# Patient Record
Sex: Male | Born: 2000 | Race: White | Hispanic: No | Marital: Single | Smoking: Current every day smoker
Health system: Southern US, Community
[De-identification: ages and names within clinical notes are randomized; demographics above are authoritative.]

## PROBLEM LIST (undated history)

## (undated) DIAGNOSIS — I499 Cardiac arrhythmia, unspecified: Secondary | ICD-10-CM

## (undated) HISTORY — PX: RIGHT HEART CATH: SHX6075

## (undated) HISTORY — PX: TONSILLECTOMY: SUR1361

---
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
## 2018-01-23 ENCOUNTER — Encounter: Payer: Self-pay | Admitting: Emergency Medicine

## 2018-01-23 ENCOUNTER — Emergency Department
Admission: EM | Admit: 2018-01-23 | Discharge: 2018-01-24 | Disposition: A | Discharge status: Home / Self Care | Payer: Medicaid Other | Attending: Emergency Medicine | Admitting: Emergency Medicine

## 2018-01-23 DIAGNOSIS — G4451 Hemicrania continua: Secondary | ICD-10-CM | POA: Insufficient documentation

## 2018-01-23 DIAGNOSIS — Z79899 Other long term (current) drug therapy: Secondary | ICD-10-CM | POA: Insufficient documentation

## 2018-01-23 DIAGNOSIS — M542 Cervicalgia: Secondary | ICD-10-CM | POA: Insufficient documentation

## 2018-01-23 DIAGNOSIS — Z8679 Personal history of other diseases of the circulatory system: Secondary | ICD-10-CM | POA: Diagnosis not present

## 2018-01-23 DIAGNOSIS — R51 Headache: Secondary | ICD-10-CM | POA: Diagnosis present

## 2018-01-23 DIAGNOSIS — R0789 Other chest pain: Secondary | ICD-10-CM | POA: Diagnosis not present

## 2018-01-23 DIAGNOSIS — G4453 Primary thunderclap headache: Secondary | ICD-10-CM

## 2018-01-23 MED ORDER — SODIUM CHLORIDE 0.9 % IV SOLN
2.0000 g | Freq: Once | INTRAVENOUS | Status: DC
  Filled 2018-01-23: qty 20

## 2018-01-23 MED ORDER — FENTANYL CITRATE (PF) 100 MCG/2ML IJ SOLN
75.0000 ug | Freq: Once | INTRAMUSCULAR | Status: AC
  Administered 2018-01-23: 75 ug via INTRAVENOUS
  Filled 2018-01-23: qty 2

## 2018-01-23 MED ORDER — PROCHLORPERAZINE EDISYLATE 10 MG/2ML IJ SOLN
10.0000 mg | Freq: Once | INTRAMUSCULAR | Status: AC
  Administered 2018-01-23: 10 mg via INTRAVENOUS
  Filled 2018-01-23: qty 2

## 2018-01-23 MED ORDER — DEXAMETHASONE SODIUM PHOSPHATE 10 MG/ML IJ SOLN
10.0000 mg | Freq: Once | INTRAMUSCULAR | Status: AC
Start: 2018-01-23 — End: 2018-01-23
  Administered 2018-01-23: 10 mg via INTRAVENOUS
  Filled 2018-01-23: qty 1

## 2018-01-23 MED ORDER — DIPHENHYDRAMINE HCL 50 MG/ML IJ SOLN
50.0000 mg | Freq: Once | INTRAMUSCULAR | Status: AC
  Administered 2018-01-23: 50 mg via INTRAVENOUS
  Filled 2018-01-23: qty 1

## 2018-01-23 MED ORDER — SODIUM CHLORIDE 0.9 % IV BOLUS
1000.0000 mL | Freq: Once | INTRAVENOUS | Status: AC
  Administered 2018-01-23: 1000 mL via INTRAVENOUS

## 2018-01-23 NOTE — ED Triage Notes (Signed)
Pt. Here for migraine.  Pt. States hx of anxiety.

## 2018-01-23 NOTE — ED Triage Notes (Signed)
Pt. Reports migraine headache, neck pain upon flexion and lt. Rib pain.
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
## 2018-01-24 ENCOUNTER — Emergency Department: Payer: Medicaid Other

## 2018-01-24 ENCOUNTER — Encounter: Payer: Self-pay | Admitting: Radiology

## 2018-01-24 LAB — COMPREHENSIVE METABOLIC PANEL
ALT: 15 U/L (ref 0–44)
AST: 30 U/L (ref 15–41)
Albumin: 4.3 g/dL (ref 3.5–5.0)
Alkaline Phosphatase: 66 U/L (ref 38–126)
Anion gap: 12 (ref 5–15)
BUN: 14 mg/dL (ref 6–20)
CALCIUM: 9.2 mg/dL (ref 8.9–10.3)
CHLORIDE: 105 mmol/L (ref 98–111)
CO2: 23 mmol/L (ref 22–32)
Creatinine, Ser: 1 mg/dL (ref 0.61–1.24)
GLUCOSE: 101 mg/dL — AB (ref 70–99)
Potassium: 3 mmol/L — ABNORMAL LOW (ref 3.5–5.1)
Sodium: 140 mmol/L (ref 135–145)
TOTAL PROTEIN: 8.2 g/dL — AB (ref 6.5–8.1)
Total Bilirubin: 0.8 mg/dL (ref 0.3–1.2)

## 2018-01-24 LAB — URINALYSIS, COMPLETE (UACMP) WITH MICROSCOPIC
Bacteria, UA: NONE SEEN
Bilirubin Urine: NEGATIVE
Glucose, UA: 50 mg/dL — AB
Hgb urine dipstick: NEGATIVE
KETONES UR: 5 mg/dL — AB
Leukocytes, UA: NEGATIVE
Nitrite: NEGATIVE
PH: 6 (ref 5.0–8.0)
PROTEIN: NEGATIVE mg/dL
Specific Gravity, Urine: 1.018 (ref 1.005–1.030)
Squamous Epithelial / LPF: NONE SEEN (ref 0–5)

## 2018-01-24 LAB — CBC WITH DIFFERENTIAL/PLATELET
Basophils Absolute: 0 10*3/uL (ref 0–0.1)
Basophils Relative: 0 %
EOS ABS: 0 10*3/uL (ref 0–0.7)
Eosinophils Relative: 0 %
HCT: 43.9 % (ref 40.0–52.0)
HEMOGLOBIN: 15 g/dL (ref 13.0–18.0)
Lymphocytes Relative: 12 %
Lymphs Abs: 2 10*3/uL (ref 1.0–3.6)
MCH: 28.4 pg (ref 26.0–34.0)
MCHC: 34.2 g/dL (ref 32.0–36.0)
MCV: 83 fL (ref 80.0–100.0)
Monocytes Absolute: 0.9 10*3/uL (ref 0.2–1.0)
Monocytes Relative: 6 %
NEUTROS PCT: 82 %
Neutro Abs: 13.4 10*3/uL — ABNORMAL HIGH (ref 1.4–6.5)
Platelets: 354 10*3/uL (ref 150–440)
RBC: 5.29 MIL/uL (ref 4.40–5.90)
RDW: 13.4 % (ref 11.5–14.5)
WBC: 16.3 10*3/uL — ABNORMAL HIGH (ref 3.8–10.6)

## 2018-01-24 LAB — LACTIC ACID, PLASMA: Lactic Acid, Venous: 1.6 mmol/L (ref 0.5–1.9)

## 2018-01-24 LAB — BRAIN NATRIURETIC PEPTIDE: B NATRIURETIC PEPTIDE 5: 6 pg/mL (ref 0.0–100.0)

## 2018-01-24 LAB — TROPONIN I: Troponin I: <0.03 ng/mL (ref <0.03)

## 2018-01-24 MED ORDER — BUTALBITAL-APAP-CAFFEINE 50-325-40 MG PO TABS: 1.0000 | ORAL_TABLET | Freq: Four times a day (QID) | ORAL | 0 refills | Status: AC | PRN

## 2018-01-24 MED ORDER — IOHEXOL 350 MG/ML SOLN
75.0000 mL | Freq: Once | INTRAVENOUS | Status: AC | PRN
  Administered 2018-01-24: 75 mL via INTRAVENOUS

## 2018-01-24 NOTE — Discharge Instructions (Signed)
Fortunately today your lab work and your CT scans were reassuring.  Please take your headache medicine as needed for severe symptoms and follow-up with your primary care physician this coming Monday as needed for reevaluation.  Return to the emergency department sooner for any new or worsening symptoms.  It was a pleasure to take care of you today, and thank you for coming to our emergency department.  If you have any questions or concerns before leaving please ask the nurse to grab me and I'm more than happy to go through your aftercare instructions again.  If you were prescribed any opioid pain medication today such as Norco, Vicodin, Percocet, morphine, hydrocodone, or oxycodone please make sure you do not drive when you are taking this medication as it can alter your ability to drive safely.  If you have any concerns once you are home that you are not improving or are in fact getting worse before you can make it to your follow-up appointment, please do not hesitate to call 911 and come back for further evaluation.  Alex BrittleNeil Shlome Baldree, MD  Results for orders placed or performed during the hospital encounter of 01/23/18  Comprehensive metabolic panel  Result Value Ref Range   Sodium 140 135 - 145 mmol/L   Potassium 3.0 (L) 3.5 - 5.1 mmol/L   Chloride 105 98 - 111 mmol/L   CO2 23 22 - 32 mmol/L   Glucose, Bld 101 (H) 70 - 99 mg/dL   BUN 14 6 - 20 mg/dL   Creatinine, Ser 4.091.00 0.61 - 1.24 mg/dL   Calcium 9.2 8.9 - 81.110.3 mg/dL   Total Protein 8.2 (H) 6.5 - 8.1 g/dL   Albumin 4.3 3.5 - 5.0 g/dL   AST 30 15 - 41 U/L   ALT 15 0 - 44 U/L   Alkaline Phosphatase 66 38 - 126 U/L   Total Bilirubin 0.8 0.3 - 1.2 mg/dL   GFR calc non Af Amer >60 >60 mL/min   GFR calc Af Amer >60 >60 mL/min   Anion gap 12 5 - 15  Lactic acid, plasma  Result Value Ref Range   Lactic Acid, Venous 1.6 0.5 - 1.9 mmol/L  Troponin I  Result Value Ref Range   Troponin I <0.03 <0.03 ng/mL  CBC with Differential  Result  Value Ref Range   WBC 16.3 (H) 3.8 - 10.6 K/uL   RBC 5.29 4.40 - 5.90 MIL/uL   Hemoglobin 15.0 13.0 - 18.0 g/dL   HCT 91.443.9 78.240.0 - 95.652.0 %   MCV 83.0 80.0 - 100.0 fL   MCH 28.4 26.0 - 34.0 pg   MCHC 34.2 32.0 - 36.0 g/dL   RDW 21.313.4 08.611.5 - 57.814.5 %   Platelets 354 150 - 440 K/uL   Neutrophils Relative % 82 %   Neutro Abs 13.4 (H) 1.4 - 6.5 K/uL   Lymphocytes Relative 12 %   Lymphs Abs 2.0 1.0 - 3.6 K/uL   Monocytes Relative 6 %   Monocytes Absolute 0.9 0.2 - 1.0 K/uL   Eosinophils Relative 0 %   Eosinophils Absolute 0.0 0 - 0.7 K/uL   Basophils Relative 0 %   Basophils Absolute 0.0 0 - 0.1 K/uL  Brain natriuretic peptide  Result Value Ref Range   B Natriuretic Peptide 6.0 0.0 - 100.0 pg/mL  Urinalysis, Complete w Microscopic  Result Value Ref Range   Color, Urine YELLOW (A) YELLOW   APPearance CLEAR (A) CLEAR   Specific Gravity, Urine 1.018 1.005 - 1.030  pH 6.0 5.0 - 8.0   Glucose, UA 50 (A) NEGATIVE mg/dL   Hgb urine dipstick NEGATIVE NEGATIVE   Bilirubin Urine NEGATIVE NEGATIVE   Ketones, ur 5 (A) NEGATIVE mg/dL   Protein, ur NEGATIVE NEGATIVE mg/dL   Nitrite NEGATIVE NEGATIVE   Leukocytes, UA NEGATIVE NEGATIVE   RBC / HPF 0-5 0 - 5 RBC/hpf   WBC, UA 0-5 0 - 5 WBC/hpf   Bacteria, UA NONE SEEN NONE SEEN   Squamous Epithelial / LPF NONE SEEN 0 - 5   Mucus PRESENT    Ct Angio Head W Or Wo Contrast  Result Date: 01/24/2018 CLINICAL DATA:  17 y/o M; migraine headache, neck pain on flexion, left-sided rib. EXAM: CT ANGIOGRAPHY HEAD AND NECK TECHNIQUE: Multidetector CT imaging of the head and neck was performed using the standard protocol during bolus administration of intravenous contrast. Multiplanar CT image reconstructions and MIPs were obtained to evaluate the vascular anatomy. Carotid stenosis measurements (when applicable) are obtained utilizing NASCET criteria, using the distal internal carotid diameter as the denominator. CONTRAST:  75 cc Omnipaque 350 COMPARISON:   08/30/2013 CT head. FINDINGS: CT HEAD FINDINGS Brain: No evidence of acute infarction, hemorrhage, hydrocephalus, extra-axial collection or mass lesion/mass effect. Vascular: No hyperdense vessel or unexpected calcification. Skull: Normal. Negative for fracture or focal lesion. Sinuses: Large right maxillary sinus mucous retention cyst. Additional paranasal sinuses and the mastoid air cells are normally aerated. Orbits: No acute finding. Review of the MIP images confirms the above findings CTA NECK FINDINGS Aortic arch: Four-vessel variant branching. Imaged portion shows no evidence of aneurysm or dissection. No significant stenosis of the major arch vessel origins. Right carotid system: No evidence of dissection, stenosis (50% or greater) or occlusion. Left carotid system: No evidence of dissection, stenosis (50% or greater) or occlusion. Vertebral arteries: Right dominant. No evidence of dissection, stenosis (50% or greater) or occlusion. Skeleton: Negative. Other neck: Stable mild prominence of upper cervical and left submandibular lymph nodes, likely reactive. Clear lung apices. Normal thyroid gland. Upper chest: Negative. Review of the MIP images confirms the above findings CTA HEAD FINDINGS Anterior circulation: No significant stenosis, proximal occlusion, aneurysm, or vascular malformation. Posterior circulation: No significant stenosis, proximal occlusion, aneurysm, or vascular malformation. Venous sinuses: As permitted by contrast timing, patent. Anatomic variants: Complete circle-of-Willis. Delayed phase: No abnormal intracranial enhancement. Review of the MIP images confirms the above findings IMPRESSION: Normal CT angiogram of the head and neck. Electronically Signed   By: Mitzi Hansen M.D.   On: 01/24/2018 01:11   Ct Angio Neck W And/or Wo Contrast  Result Date: 01/24/2018 CLINICAL DATA:  17 y/o M; migraine headache, neck pain on flexion, left-sided rib. EXAM: CT ANGIOGRAPHY HEAD AND  NECK TECHNIQUE: Multidetector CT imaging of the head and neck was performed using the standard protocol during bolus administration of intravenous contrast. Multiplanar CT image reconstructions and MIPs were obtained to evaluate the vascular anatomy. Carotid stenosis measurements (when applicable) are obtained utilizing NASCET criteria, using the distal internal carotid diameter as the denominator. CONTRAST:  75 cc Omnipaque 350 COMPARISON:  08/30/2013 CT head. FINDINGS: CT HEAD FINDINGS Brain: No evidence of acute infarction, hemorrhage, hydrocephalus, extra-axial collection or mass lesion/mass effect. Vascular: No hyperdense vessel or unexpected calcification. Skull: Normal. Negative for fracture or focal lesion. Sinuses: Large right maxillary sinus mucous retention cyst. Additional paranasal sinuses and the mastoid air cells are normally aerated. Orbits: No acute finding. Review of the MIP images confirms the above findings CTA NECK FINDINGS  Aortic arch: Four-vessel variant branching. Imaged portion shows no evidence of aneurysm or dissection. No significant stenosis of the major arch vessel origins. Right carotid system: No evidence of dissection, stenosis (50% or greater) or occlusion. Left carotid system: No evidence of dissection, stenosis (50% or greater) or occlusion. Vertebral arteries: Right dominant. No evidence of dissection, stenosis (50% or greater) or occlusion. Skeleton: Negative. Other neck: Stable mild prominence of upper cervical and left submandibular lymph nodes, likely reactive. Clear lung apices. Normal thyroid gland. Upper chest: Negative. Review of the MIP images confirms the above findings CTA HEAD FINDINGS Anterior circulation: No significant stenosis, proximal occlusion, aneurysm, or vascular malformation. Posterior circulation: No significant stenosis, proximal occlusion, aneurysm, or vascular malformation. Venous sinuses: As permitted by contrast timing, patent. Anatomic variants:  Complete circle-of-Willis. Delayed phase: No abnormal intracranial enhancement. Review of the MIP images confirms the above findings IMPRESSION: Normal CT angiogram of the head and neck. Electronically Signed   By: Mitzi Hansen M.D.   On: 01/24/2018 01:11

## 2018-01-24 NOTE — ED Notes (Signed)
Patient transported to CT 

## 2018-11-14 ENCOUNTER — Ambulatory Visit
Admission: EM | Admit: 2018-11-14 | Discharge: 2018-11-14 | Disposition: A | Discharge status: Home / Self Care | Payer: Medicaid Other | Attending: Family Medicine | Admitting: Family Medicine

## 2018-11-14 ENCOUNTER — Other Ambulatory Visit: Payer: Self-pay

## 2018-11-14 ENCOUNTER — Encounter: Payer: Self-pay | Admitting: Emergency Medicine

## 2018-11-14 ENCOUNTER — Ambulatory Visit: Payer: Medicaid Other

## 2018-11-14 DIAGNOSIS — M25521 Pain in right elbow: Secondary | ICD-10-CM | POA: Diagnosis not present

## 2018-11-14 MED ORDER — MELOXICAM 15 MG PO TABS: 15.0000 mg | ORAL_TABLET | Freq: Every day | ORAL | 0 refills | Status: DC | PRN

## 2018-11-14 NOTE — Discharge Instructions (Addendum)
Take medication as prescribed. Rest. Drink plenty of fluids. Ice. Use sling when active. Frequent range of motion exercises as discussed.   Follow-up with orthopedic this coming week for continued pain. See above.   Follow up with your primary care physician this week as needed. Return to Urgent care or emergency room for any numbness, decreased range of motion, worsening pains or worsening concerns.

## 2018-11-14 NOTE — ED Triage Notes (Signed)
Patient c/o pain in his right elbow that goes down into his right arm about 30 min ago.  Patient states that he was sparring.

## 2018-11-14 NOTE — ED Provider Notes (Signed)
[facility] ____________________________________________  Time seen: Approximately 12:50 PM  I have reviewed the triage vital signs and the nursing notes.   HISTORY  Chief Complaint Arm Pain (right)  HPI Alex Hudson. is a 18 y.o. male presenting for evaluation of right elbow pain.  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  Patient reports Wednesday he had several falls on his elbow and he could feel some pain.  States continuing Thursday and Friday he had some pain to his elbow stating he thought he "hyperextended" his elbow and it would get better so he continued to remain active.  States pain increased last night after a training session, and states today he also landed on his elbow having increased pain.  States since injury today his pain in his elbow has been much more present and he has had difficulty extending and flexing his elbow.  States this has been over the last 1 hour.  States currently the pain is better than it was an hour ago, stating earlier the pain was radiating down his forearm to his hand.  Denies any paresthesias or loss of sensation.  States he is able to still fully make a fist and extend all fingers but he has weakened handgrip. Did take one ibuprofen and applied ice.  Denies other alleviating measures.  Denies injuries to the same area in the past.  No recent cough, congestion, chest pain or shortness of breath or other sickness.  Reports otherwise doing well.   Past Medical History:  Diagnosis Date  . Ventricular arrhythmia    RIGHT    There are no active problems to display for this patient.   Past Surgical History:  Procedure Laterality Date  . RIGHT HEART CATH    . TONSILLECTOMY       No current facility-administered medications for this encounter.   Current Outpatient Medications:  .  butalbital-acetaminophen-caffeine (FIORICET, ESGIC)  50-325-40 MG tablet, Take 1-2 tablets by mouth every 6 (six) hours as needed for headache., Disp: 20 tablet, Rfl: 0 .  cholecalciferol (VITAMIN D) 1000 units tablet, Take 1,000 Units by mouth daily., Disp: , Rfl:  .  dicyclomine (BENTYL) 10 MG capsule, Take 1 capsule (10 mg total) by mouth every 6 (six) hours as needed for up to 3 days (abdominal pain or cramping)., Disp: 12 capsule, Rfl: 0 .  famotidine (PEPCID) 20 MG tablet, Take 1 tablet (20 mg total) by mouth 2 (two) times daily for 15 days., Disp: 30 tablet, Rfl: 0 .  meloxicam (MOBIC) 15 MG tablet, Take 1 tablet (15 mg total) by mouth daily as needed., Disp: 10 tablet, Rfl: 0  Allergies Augmentin [amoxicillin-pot clavulanate]  Family History  Problem Relation Age of Onset  . Hypertension Mother   . Hypertension Father     Social History Social History   Tobacco Use  . Smoking status: Never Smoker  . Smokeless tobacco: Never Used  Substance Use Topics  . Alcohol use: No  . Drug use: Not on file    Review of Systems Constitutional: No fever ENT: No sore throat. Cardiovascular: Denies chest pain. Respiratory: Denies shortness of breath. Gastrointestinal: No abdominal pain.   Musculoskeletal: Negative for back pain.  Positive right elbow pain. Skin: Negative for rash.   ____________________________________________   PHYSICAL EXAM:  VITAL SIGNS: ED Triage Vitals  Enc Vitals Group     BP 11/14/18 1231 128/67  Pulse Rate 11/14/18 1231 (!) 102     Resp 11/14/18 1231 16     Temp 11/14/18 1231 98 F (36.7 C)     Temp Source 11/14/18 1231 Oral     SpO2 11/14/18 1231 98 %     Weight 11/14/18 1229 150 lb (68 kg)     Height 11/14/18 1229 5\' 8"  (1.727 m)     Head Circumference --      Peak Flow --      Pain Score 11/14/18 1229 5     Pain Loc --      Pain Edu? --      Excl. in GC? --     Constitutional: Alert and oriented. Well appearing and in no acute distress. Eyes: Conjunctivae are normal. ENT      Head:  Normocephalic and atraumatic. Cardiovascular: Normal rate, regular rhythm. Grossly normal heart sounds.  Good peripheral circulation. Respiratory: Normal respiratory effort without tachypnea nor retractions. Breath sounds are clear and equal bilaterally. No wheezes, rales, rhonchi. Musculoskeletal: Bilateral distal radial pulses equal.  Bilateral distal brachial pulses equal.  Steady gait. Except: Moderate tenderness to posterior bony elbow and mild tenderness to medial elbow, no swelling, no ecchymosis, painful and difficulty fully extending elbow, mild pain and able to flex elbow, no paresthesias to right arm, able to thumb touch to each finger and make full fist and fully extend fingers, able to fully flex and dorsiflex right wrist, right shoulder nontender.  Neurologic:  Normal speech and language.Speech is normal. No gait instability.  Skin:  Skin is warm, dry and intact. No rash noted. Psychiatric: Mood and affect are normal. Speech and behavior are normal. Patient exhibits appropriate insight and judgment   ___________________________________________   LABS (all labs ordered are listed, but only abnormal results are displayed)  Labs Reviewed - No data to display ____________________________________________  RADIOLOGY  Dg Elbow Complete Right  Result Date: 11/14/2018 CLINICAL DATA:  Right elbow pain and decreased range of motion since a hyperextension injury 3 days ago. Initial encounter. EXAM: RIGHT ELBOW - COMPLETE 3+ VIEW COMPARISON:  None. FINDINGS: There is no evidence of fracture, dislocation, or joint effusion. There is no evidence of arthropathy or other focal bone abnormality. Soft tissues are unremarkable. IMPRESSION: Negative exam. Electronically Signed   By: Drusilla Kannerhomas  Dalessio M.D.   On: 11/14/2018 13:45   ____________________________________________   PROCEDURES Procedures    INITIAL IMPRESSION / ASSESSMENT AND PLAN / ED COURSE  Pertinent labs & imaging results that  were available during my care of the patient were reviewed by me and considered in my medical decision making (see chart for details).  Well-appearing patient.  No acute distress.  Right elbow pain after multiple recent injuries.  Some limitation range of motion of elbow extension, no paresthesias. Right elbow x-ray as above per radiologist, negative.  Concern for contusion and sprain injuries.  Recommend ice, elevation, rest, and will Rx Mobic.  Sling given for support, however counseled regarding frequent range of motion exercises to shoulder and elbow are important.  Follow-up with orthopedic this coming week.  Discussed strict reevaluation and return parameters.Discussed indication, risks and benefits of medications with patient.  Discussed follow up with Primary care physician this week as needed. Discussed follow up and return parameters including no resolution or any worsening concerns. Patient verbalized understanding and agreed to plan.   ____________________________________________   FINAL CLINICAL IMPRESSION(S) / ED DIAGNOSES  Final diagnoses:  Right elbow pain     ED Discharge  Orders         Ordered    meloxicam (MOBIC) 15 MG tablet  Daily PRN     11/14/18 1358           Note: This dictation was prepared with Dragon dictation along with smaller phrase technology. Any transcriptional errors that result from this process are unintentional.         Renford DillsMiller, Leighton Brickley, NP 11/14/18 1428

## 2018-12-09 ENCOUNTER — Other Ambulatory Visit: Payer: Self-pay

## 2018-12-09 DIAGNOSIS — Z20822 Contact with and (suspected) exposure to covid-19: Secondary | ICD-10-CM

## 2018-12-10 LAB — NOVEL CORONAVIRUS, NAA: SARS-CoV-2, NAA: NOT DETECTED

## 2019-07-10 ENCOUNTER — Inpatient Hospital Stay
Admission: EM | Admit: 2019-07-10 | Discharge: 2019-07-12 | DRG: 076 | Disposition: A | Discharge status: Home / Self Care | Payer: Medicaid Other | Attending: Internal Medicine | Admitting: Internal Medicine

## 2019-07-10 ENCOUNTER — Emergency Department: Payer: Medicaid Other

## 2019-07-10 ENCOUNTER — Other Ambulatory Visit: Payer: Self-pay

## 2019-07-10 DIAGNOSIS — A879 Viral meningitis, unspecified: Secondary | ICD-10-CM | POA: Diagnosis not present

## 2019-07-10 DIAGNOSIS — R519 Headache, unspecified: Secondary | ICD-10-CM

## 2019-07-10 DIAGNOSIS — Z8249 Family history of ischemic heart disease and other diseases of the circulatory system: Secondary | ICD-10-CM

## 2019-07-10 DIAGNOSIS — G039 Meningitis, unspecified: Secondary | ICD-10-CM | POA: Diagnosis present

## 2019-07-10 DIAGNOSIS — Z881 Allergy status to other antibiotic agents status: Secondary | ICD-10-CM

## 2019-07-10 DIAGNOSIS — Z20822 Contact with and (suspected) exposure to covid-19: Secondary | ICD-10-CM | POA: Diagnosis present

## 2019-07-10 DIAGNOSIS — G43909 Migraine, unspecified, not intractable, without status migrainosus: Secondary | ICD-10-CM | POA: Diagnosis present

## 2019-07-10 DIAGNOSIS — Z79899 Other long term (current) drug therapy: Secondary | ICD-10-CM

## 2019-07-10 DIAGNOSIS — T375X5A Adverse effect of antiviral drugs, initial encounter: Secondary | ICD-10-CM | POA: Diagnosis not present

## 2019-07-10 DIAGNOSIS — R197 Diarrhea, unspecified: Secondary | ICD-10-CM

## 2019-07-10 DIAGNOSIS — L258 Unspecified contact dermatitis due to other agents: Secondary | ICD-10-CM | POA: Diagnosis not present

## 2019-07-10 DIAGNOSIS — F909 Attention-deficit hyperactivity disorder, unspecified type: Secondary | ICD-10-CM | POA: Diagnosis present

## 2019-07-10 DIAGNOSIS — E876 Hypokalemia: Secondary | ICD-10-CM | POA: Diagnosis present

## 2019-07-10 LAB — CSF CELL COUNT WITH DIFFERENTIAL
Eosinophils, CSF: 0 %
Eosinophils, CSF: 0 %
Lymphs, CSF: 24 %
Lymphs, CSF: 9 %
Monocyte-Macrophage-Spinal Fluid: 0 %
Monocyte-Macrophage-Spinal Fluid: 1 %
Other Cells, CSF: 0
Other Cells, CSF: 0
RBC Count, CSF: 547 /mm3 — ABNORMAL HIGH (ref 0–3)
RBC Count, CSF: 55 /mm3 — ABNORMAL HIGH (ref 0–3)
Segmented Neutrophils-CSF: 0 %
Segmented Neutrophils-CSF: 2 %
Tube #: 1
Tube #: 3
WBC, CSF: 28 /mm3 (ref 0–5)
WBC, CSF: 7 /mm3 — ABNORMAL HIGH (ref 0–5)

## 2019-07-10 LAB — SARS CORONAVIRUS 2 (TAT 6-24 HRS): SARS Coronavirus 2: NEGATIVE

## 2019-07-10 LAB — BASIC METABOLIC PANEL
Anion gap: 7 (ref 5–15)
BUN: 15 mg/dL (ref 6–20)
CO2: 24 mmol/L (ref 22–32)
Calcium: 9.2 mg/dL (ref 8.9–10.3)
Chloride: 104 mmol/L (ref 98–111)
Creatinine, Ser: 1.01 mg/dL (ref 0.61–1.24)
GFR calc Af Amer: >60 mL/min (ref >60)
GFR calc non Af Amer: >60 mL/min (ref >60)
Glucose, Bld: 118 mg/dL — ABNORMAL HIGH (ref 70–99)
Potassium: 3.4 mmol/L — ABNORMAL LOW (ref 3.5–5.1)
Sodium: 135 mmol/L (ref 135–145)

## 2019-07-10 LAB — URINE DRUG SCREEN, QUALITATIVE (ARMC ONLY)
Amphetamines, Ur Screen: NOT DETECTED
Barbiturates, Ur Screen: NOT DETECTED
Benzodiazepine, Ur Scrn: NOT DETECTED
Cannabinoid 50 Ng, Ur ~~LOC~~: NOT DETECTED
Cocaine Metabolite,Ur ~~LOC~~: NOT DETECTED
MDMA (Ecstasy)Ur Screen: NOT DETECTED
Methadone Scn, Ur: NOT DETECTED
Opiate, Ur Screen: NOT DETECTED
Phencyclidine (PCP) Ur S: NOT DETECTED
Tricyclic, Ur Screen: NOT DETECTED

## 2019-07-10 LAB — CBC WITH DIFFERENTIAL/PLATELET
Abs Immature Granulocytes: 0.03 10*3/uL (ref 0.00–0.07)
Basophils Absolute: 0 10*3/uL (ref 0.0–0.1)
Basophils Relative: 0 %
Eosinophils Absolute: 0.2 10*3/uL (ref 0.0–0.5)
Eosinophils Relative: 3 %
HCT: 43.8 % (ref 39.0–52.0)
Hemoglobin: 14.6 g/dL (ref 13.0–17.0)
Immature Granulocytes: 0 %
Lymphocytes Relative: 45 %
Lymphs Abs: 4.2 10*3/uL — ABNORMAL HIGH (ref 0.7–4.0)
MCH: 28 pg (ref 26.0–34.0)
MCHC: 33.3 g/dL (ref 30.0–36.0)
MCV: 83.9 fL (ref 80.0–100.0)
Monocytes Absolute: 0.7 10*3/uL (ref 0.1–1.0)
Monocytes Relative: 7 %
Neutro Abs: 4.1 10*3/uL (ref 1.7–7.7)
Neutrophils Relative %: 45 %
Platelets: 375 10*3/uL (ref 150–400)
RBC: 5.22 MIL/uL (ref 4.22–5.81)
RDW: 12.2 % (ref 11.5–15.5)
WBC: 9.2 10*3/uL (ref 4.0–10.5)
nRBC: 0 % (ref 0.0–0.2)

## 2019-07-10 LAB — POC SARS CORONAVIRUS 2 AG: SARS Coronavirus 2 Ag: NEGATIVE

## 2019-07-10 LAB — ETHANOL: Alcohol, Ethyl (B): <10 mg/dL (ref <10)

## 2019-07-10 LAB — MAGNESIUM: Magnesium: 2.2 mg/dL (ref 1.7–2.4)

## 2019-07-10 LAB — PROTEIN AND GLUCOSE, CSF
Glucose, CSF: 56 mg/dL (ref 40–70)
Total  Protein, CSF: 25 mg/dL (ref 15–45)

## 2019-07-10 MED ORDER — LIDOCAINE-PRILOCAINE 2.5-2.5 % EX CREA
TOPICAL_CREAM | Freq: Once | CUTANEOUS | Status: AC
  Filled 2019-07-10: qty 5

## 2019-07-10 MED ORDER — ACETAMINOPHEN 325 MG PO TABS
650.0000 mg | ORAL_TABLET | Freq: Four times a day (QID) | ORAL | Status: DC | PRN
  Administered 2019-07-11 (×2): 650 mg via ORAL
  Filled 2019-07-10 (×2): qty 2

## 2019-07-10 MED ORDER — SODIUM CHLORIDE 0.9 % IV BOLUS
1000.0000 mL | Freq: Once | INTRAVENOUS | Status: AC
  Administered 2019-07-10: 1000 mL via INTRAVENOUS

## 2019-07-10 MED ORDER — METOCLOPRAMIDE HCL 5 MG/ML IJ SOLN
10.0000 mg | Freq: Once | INTRAMUSCULAR | Status: AC
  Administered 2019-07-10: 10 mg via INTRAVENOUS
  Filled 2019-07-10: qty 2

## 2019-07-10 MED ORDER — LORAZEPAM 2 MG/ML IJ SOLN
INTRAMUSCULAR | Status: AC
  Administered 2019-07-10: 1 mg via INTRAVENOUS
  Filled 2019-07-10: qty 1

## 2019-07-10 MED ORDER — DEXTROSE 5 % IV SOLN
10.0000 mg/kg | Freq: Three times a day (TID) | INTRAVENOUS | Status: DC
  Administered 2019-07-10: 660 mg via INTRAVENOUS
  Filled 2019-07-10 (×6): qty 13.2

## 2019-07-10 MED ORDER — DICYCLOMINE HCL 10 MG PO CAPS
10.0000 mg | ORAL_CAPSULE | Freq: Four times a day (QID) | ORAL | Status: DC | PRN
  Filled 2019-07-10: qty 1

## 2019-07-10 MED ORDER — ONDANSETRON HCL 4 MG PO TABS: 4.0000 mg | ORAL_TABLET | Freq: Four times a day (QID) | ORAL | Status: DC | PRN

## 2019-07-10 MED ORDER — FAMOTIDINE 20 MG PO TABS
20.0000 mg | ORAL_TABLET | Freq: Two times a day (BID) | ORAL | Status: DC
  Administered 2019-07-11 – 2019-07-12 (×2): 20 mg via ORAL
  Filled 2019-07-10 (×3): qty 1

## 2019-07-10 MED ORDER — DEXTROSE 5 % IV SOLN
10.0000 mg/kg | Freq: Once | INTRAVENOUS | Status: DC
  Filled 2019-07-10: qty 13.2

## 2019-07-10 MED ORDER — ONDANSETRON HCL 4 MG/2ML IJ SOLN
4.0000 mg | Freq: Four times a day (QID) | INTRAMUSCULAR | Status: DC | PRN
  Administered 2019-07-11 – 2019-07-12 (×3): 4 mg via INTRAVENOUS
  Filled 2019-07-10 (×3): qty 2

## 2019-07-10 MED ORDER — POTASSIUM CHLORIDE IN NACL 20-0.9 MEQ/L-% IV SOLN
INTRAVENOUS | Status: DC
  Filled 2019-07-10 (×7): qty 1000

## 2019-07-10 MED ORDER — LORAZEPAM 2 MG/ML IJ SOLN: 1.0000 mg | Freq: Once | INTRAMUSCULAR | Status: AC

## 2019-07-10 MED ORDER — KETAMINE HCL 10 MG/ML IJ SOLN: 1.0000 mg/kg | Freq: Once | INTRAMUSCULAR | Status: DC

## 2019-07-10 MED ORDER — ENOXAPARIN SODIUM 40 MG/0.4ML ~~LOC~~ SOLN
40.0000 mg | SUBCUTANEOUS | Status: DC
  Filled 2019-07-10: qty 0.4

## 2019-07-10 MED ORDER — ACETAMINOPHEN 650 MG RE SUPP: 650.0000 mg | Freq: Four times a day (QID) | RECTAL | Status: DC | PRN

## 2019-07-10 MED ORDER — MELOXICAM 7.5 MG PO TABS
15.0000 mg | ORAL_TABLET | Freq: Every day | ORAL | Status: DC | PRN
  Filled 2019-07-10 (×2): qty 2

## 2019-07-10 MED ORDER — SODIUM CHLORIDE 0.9 % IV SOLN
2.0000 g | Freq: Once | INTRAVENOUS | Status: AC
  Administered 2019-07-10: 08:00:00 2 g via INTRAVENOUS
  Filled 2019-07-10: qty 20
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
## 2019-07-10 NOTE — ED Notes (Signed)
ED Provider at bedside. 
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
## 2019-07-10 NOTE — ED Notes (Signed)
Report given to Kim, RN.

## 2019-07-10 NOTE — Progress Notes (Signed)
ID PROGRESS NOTE  ED provider, Dr Roxan Hockey, asked me to weigh in on management of viral/aseptic meningitis in 18yo M with pmhx of migraine ha, who now has new onset headache, neck pain. Nausea, photophobia which started the night prior at work. He underwent infectious work up. CSF appears 1st tube traumatic tap with several RBC of 547, WBC 6 with 9% L, protein 25 and glu 56. #3rd tube shows WBC of 28 with no seg and 24% lymph. No organism on gram stain. Patient still appears not back to his baseline, still groggy post LP. He was initially started on ceftriaxone, however when they noticed cell count, they called ID for recommendations.  Serum wbc of 9.2 45% seg, 45% lymph  A/P: 18yo M with what appears to be aseptic meningitis, possibly due to viral causes 1) please start IV acyclovir, and keep patient well hydrated with IVF 100-166mL/hr NS 2) please get HSV DNA PCR added to CSF 3) consider getting MRI of brain with contrast if patient is not improving over the next 24hrs 4) will also add enterovirus panel to CSF  Alex Bour B. Drue Second MD MPH [facility] [phone]

## 2019-07-10 NOTE — Progress Notes (Signed)
Pt began to have swelling and redness with pain at IV site. IV fluids and IV Acyclovir stopped. MD notified.

## 2019-07-10 NOTE — Progress Notes (Signed)
Pt on the floor from ED with suspected Viral meningitis. A&O x4. IV in place. RA. Oriented to unit, staff and call bell placed within reach. RN to release orders and follow current care plan.

## 2019-07-10 NOTE — ED Notes (Signed)
Pt ambulated to the restroom with stand by assistance.

## 2019-07-10 NOTE — H&P (Signed)
History and Physical    Alex Hudson. TKW:[id number] DOB: 07-22-2000 DOA: 07/10/2019  PCP: Therapy, [facility]   Patient coming from: Home  I have personally briefly reviewed patient's old medical records in [place]  Chief Complaint: Headache  HPI: Alex Werts. is a 19 y.o. male with medical history significant for migraine headaches, accelerated ventricular rhythm, ADHD and asthma who presented to the emergency room for evaluation of headache and mental status changes.  Patient said he fell out of a vehicle outside the emergency room when he tried to throw up.  He was picked up the sidewalk and carried into the emergency room.  He works third shift at [employer] and stated that he started having headaches which he described as global and he assumed it was his usual migraine headaches.  Intensity of the headache was initially mild and then it became severe after he drank some soda.  It was associated with nausea and dry heaving as well as photophobia and neck pain but no vomiting.  He denied any trauma, he denies drug or alcohol use and denied having a fever.  He complained of chills.  He denies having any chest pain, shortness of breath, nausea, vomiting, diaphoresis or palpitations.  He has no abdominal pain or changes in his bowel habits.  Denies having any urinary symptoms  ED Course: Patient was seen in the emergency room and due to concerns for possible meningitis he had a lumbar puncture done. CSF fluid had RBC and lymphocyte predominance. Patient continues to have waxing and waning of his mental status. ED provider discussed with ID who recommends observation admission for possible viral meningitis.  Review of Systems: As per HPI otherwise 10 point review of systems negative.    Past Medical History:  Diagnosis Date  . Ventricular arrhythmia    RIGHT    Past Surgical History:  Procedure Laterality Date  . RIGHT HEART CATH    . TONSILLECTOMY        reports that he has never smoked. He has never used smokeless tobacco. He reports that he does not drink alcohol. No history on file for drug.  Allergies  Allergen Reactions  . Augmentin [Amoxicillin-Pot Clavulanate] Diarrhea    Family History  Problem Relation Age of Onset  . Hypertension Mother   . Hypertension Father      Prior to Admission medications   Medication Sig Start Date End Date Taking? Authorizing Provider  cholecalciferol (VITAMIN D) 1000 units tablet Take 1,000 Units by mouth daily.    [provider]  meloxicam (MOBIC) 15 MG tablet Take 1 tablet (15 mg total) by mouth daily as needed. 11/14/18   Marylene Land, NP  cetirizine (ZYRTEC) 10 MG tablet Take 10 mg by mouth at bedtime as needed for allergies. 09/10/17 11/14/18  [provider]  fluticasone (FLONASE) 50 MCG/ACT nasal spray Place 1 spray into both nostrils daily.  11/14/18  [provider]    Physical Exam: Vitals:   07/10/19 0641 07/10/19 0700 07/10/19 0715 07/10/19 0930  BP: 131/84 (!) 129/94 (!) 128/91 114/72  Pulse: (!) 107 (!) 103 87 87  Resp: (!) 23 11 18 18   Temp:      TempSrc:      SpO2: 100% 100% 98% 97%  Weight:      Height:         Vitals:   07/10/19 0641 07/10/19 0700 07/10/19 0715 07/10/19 0930  BP: 131/84 (!) 129/94 (!) 128/91  114/72  Pulse: (!) 107 (!) 103 87 87  Resp: (!) 23 11 18 18   Temp:      TempSrc:      SpO2: 100% 100% 98% 97%  Weight:      Height:        Constitutional: NAD, alert and oriented to person and place but not to time Eyes: PERRL, lids and conjunctivae normal ENMT: Mucous membranes are moist.  Neck: normal, supple, no masses, no thyromegaly Respiratory: clear to auscultation bilaterally, no wheezing, no crackles. Normal respiratory effort. No accessory muscle use.  Cardiovascular: Regular rate and rhythm, no murmurs / rubs / gallops. No extremity edema. 2+ pedal pulses. No carotid bruits.  Abdomen: no tenderness, no masses  palpated. No hepatosplenomegaly. Bowel sounds positive.  Musculoskeletal: no clubbing / cyanosis. No joint deformity upper and lower extremities.  Skin: no rashes, lesions, ulcers.  Neurologic: No gross focal neurologic deficit.  No neck stiffness Psychiatric: Normal mood and affect.   Labs on Admission: I have personally reviewed following labs and imaging studies  CBC: Recent Labs  Lab 07/10/19 0432  WBC 9.2  NEUTROABS 4.1  HGB 14.6  HCT 43.8  MCV 83.9  PLT 375   Basic Metabolic Panel: Recent Labs  Lab 07/10/19 0432  NA 135  K 3.4*  CL 104  CO2 24  GLUCOSE 118*  BUN 15  CREATININE 1.01  CALCIUM 9.2   GFR: Estimated Creatinine Clearance: 108.6 mL/min (by C-G formula based on SCr of 1.01 mg/dL). Liver Function Tests: No results for input(s): AST, ALT, ALKPHOS, BILITOT, PROT, ALBUMIN in the last 168 hours. No results for input(s): LIPASE, AMYLASE in the last 168 hours. No results for input(s): AMMONIA in the last 168 hours. Coagulation Profile: No results for input(s): INR, PROTIME in the last 168 hours. Cardiac Enzymes: No results for input(s): CKTOTAL, CKMB, CKMBINDEX, TROPONINI in the last 168 hours. BNP (last 3 results) No results for input(s): PROBNP in the last 8760 hours. HbA1C: No results for input(s): HGBA1C in the last 72 hours. CBG: No results for input(s): GLUCAP in the last 168 hours. Lipid Profile: No results for input(s): CHOL, HDL, LDLCALC, TRIG, CHOLHDL, LDLDIRECT in the last 72 hours. Thyroid Function Tests: No results for input(s): TSH, T4TOTAL, FREET4, T3FREE, THYROIDAB in the last 72 hours. Anemia Panel: No results for input(s): VITAMINB12, FOLATE, FERRITIN, TIBC, IRON, RETICCTPCT in the last 72 hours. Urine analysis:    Component Value Date/Time   COLORURINE YELLOW (A) 01/23/2018 2338   APPEARANCEUR CLEAR (A) 01/23/2018 2338   APPEARANCEUR Clear 11/14/2013 1455   LABSPEC 1.018 01/23/2018 2338   LABSPEC 1.021 11/14/2013 1455   PHURINE  6.0 01/23/2018 2338   GLUCOSEU 50 (A) 01/23/2018 2338   GLUCOSEU 50 mg/dL 2339 93/71/6967   HGBUR NEGATIVE 01/23/2018 2338   BILIRUBINUR NEGATIVE 01/23/2018 2338   BILIRUBINUR Negative 11/14/2013 1455   KETONESUR 5 (A) 01/23/2018 2338   PROTEINUR NEGATIVE 01/23/2018 2338   NITRITE NEGATIVE 01/23/2018 2338   LEUKOCYTESUR NEGATIVE 01/23/2018 2338   LEUKOCYTESUR Negative 11/14/2013 1455    Radiological Exams on Admission: CT Head Wo Contrast  Result Date: 07/10/2019 CLINICAL DATA:  09/09/2019 out of a car.  Headache. EXAM: CT HEAD WITHOUT CONTRAST TECHNIQUE: Contiguous axial images were obtained from the base of the skull through the vertex without intravenous contrast. COMPARISON:  08/30/2013. FINDINGS: Brain: The brain shows a normal appearance without evidence of malformation, atrophy, old or acute small or large vessel infarction, mass lesion, hemorrhage, hydrocephalus or extra-axial  collection. Vascular: No hyperdense vessel. No evidence of atherosclerotic calcification. Skull: Normal.  No traumatic finding.  No focal bone lesion. Sinuses/Orbits: Sinuses are clear. Orbits appear normal. Mastoids are clear. Other: None significant IMPRESSION: Normal head CT Electronically Signed   By: Paulina Fusi M.D.   On: 07/10/2019 05:59    EKG: Independently reviewed.  Atrial fibrillation  Assessment/Plan Principal Problem:   Viral meningitis, unspecified Active Problems:   Hypokalemia   Migraine headache    Probable viral meningitis Patient presented to the emergency room for evaluation of headache different from from his usual migraine headache associated with confusion and neck stiffness.  He had a lumbar puncture done in the emergency room and the fluid showed lymphocyte predominance and some RBC's. Due to patient's waxing and waning mental status, not back to baseline at the time of admission and is oriented to person and place but not to time, ID was consulted and recommended to place patient on  acyclovir for presumed viral meningitis Follow-up results of CSF culture Symptom control   Hypokalemia Supplement potassium   Migraine headache Resolved Symptom control with NSAIDs and antiemetics as needed   History of accelerated ventricular rhythm Patient follows up with pediatric cardiology at [facility] No exacerbation at this time  DVT prophylaxis: Lovenox Code Status: Full code Family Communication: Plan of care discussed with patient at the bedside Disposition Plan: Back to previous home environment Consults called: ID    Daysha Ashmore MD [facility]     07/10/2019, 11:27 AM

## 2019-07-10 NOTE — ED Provider Notes (Signed)
Patient reassessed. Still feels groggy and somewhat confused. No focal deficits. CSF very likely is traumatic but the third tube does have a lymphocytic predominance with increasing white cell count possibly related to viral meningitis?Marland Kitchen Also has lymphocytic predominance on his CBC. His rapid Covid is negative. He is not toxic appearing but with his presentation and patient not being back to baseline I did discuss the case with infectious disease, Dr. Drue Second who does agree with plan for empirically covering for viral meningitis given his presentation and observation the hospital. Discussed with hospitalist. Patient agreeable to plan.   Willy Eddy, MD 07/10/19 [phone]

## 2019-07-10 NOTE — ED Notes (Signed)
Report received from Paige, RN

## 2019-07-11 ENCOUNTER — Observation Stay: Payer: Medicaid Other

## 2019-07-11 DIAGNOSIS — A879 Viral meningitis, unspecified: Secondary | ICD-10-CM | POA: Diagnosis present

## 2019-07-11 DIAGNOSIS — G43909 Migraine, unspecified, not intractable, without status migrainosus: Secondary | ICD-10-CM | POA: Diagnosis present

## 2019-07-11 DIAGNOSIS — Z20822 Contact with and (suspected) exposure to covid-19: Secondary | ICD-10-CM | POA: Diagnosis present

## 2019-07-11 DIAGNOSIS — R197 Diarrhea, unspecified: Secondary | ICD-10-CM

## 2019-07-11 DIAGNOSIS — L258 Unspecified contact dermatitis due to other agents: Secondary | ICD-10-CM | POA: Diagnosis not present

## 2019-07-11 DIAGNOSIS — E876 Hypokalemia: Secondary | ICD-10-CM | POA: Diagnosis not present

## 2019-07-11 DIAGNOSIS — Z8249 Family history of ischemic heart disease and other diseases of the circulatory system: Secondary | ICD-10-CM | POA: Diagnosis not present

## 2019-07-11 DIAGNOSIS — T375X5A Adverse effect of antiviral drugs, initial encounter: Secondary | ICD-10-CM | POA: Diagnosis not present

## 2019-07-11 DIAGNOSIS — R519 Headache, unspecified: Secondary | ICD-10-CM | POA: Diagnosis not present

## 2019-07-11 DIAGNOSIS — Z79899 Other long term (current) drug therapy: Secondary | ICD-10-CM | POA: Diagnosis not present

## 2019-07-11 DIAGNOSIS — F909 Attention-deficit hyperactivity disorder, unspecified type: Secondary | ICD-10-CM | POA: Diagnosis present

## 2019-07-11 DIAGNOSIS — Z881 Allergy status to other antibiotic agents status: Secondary | ICD-10-CM | POA: Diagnosis not present

## 2019-07-11 LAB — HIV ANTIBODY (ROUTINE TESTING W REFLEX): HIV Screen 4th Generation wRfx: NONREACTIVE

## 2019-07-11 LAB — CBC
HCT: 40.9 % (ref 39.0–52.0)
Hemoglobin: 13.7 g/dL (ref 13.0–17.0)
MCH: 28.4 pg (ref 26.0–34.0)
MCHC: 33.5 g/dL (ref 30.0–36.0)
MCV: 84.9 fL (ref 80.0–100.0)
Platelets: 342 10*3/uL (ref 150–400)
RBC: 4.82 MIL/uL (ref 4.22–5.81)
RDW: 12.2 % (ref 11.5–15.5)
WBC: 7.7 10*3/uL (ref 4.0–10.5)
nRBC: 0 % (ref 0.0–0.2)

## 2019-07-11 LAB — BASIC METABOLIC PANEL
Anion gap: 11 (ref 5–15)
BUN: 9 mg/dL (ref 6–20)
CO2: 22 mmol/L (ref 22–32)
Calcium: 8.7 mg/dL — ABNORMAL LOW (ref 8.9–10.3)
Chloride: 105 mmol/L (ref 98–111)
Creatinine, Ser: 0.73 mg/dL (ref 0.61–1.24)
GFR calc Af Amer: >60 mL/min (ref >60)
GFR calc non Af Amer: >60 mL/min (ref >60)
Glucose, Bld: 74 mg/dL (ref 70–99)
Potassium: 3.8 mmol/L (ref 3.5–5.1)
Sodium: 138 mmol/L (ref 135–145)

## 2019-07-11 LAB — C DIFFICILE QUICK SCREEN W PCR REFLEX
C Diff antigen: NEGATIVE
C Diff interpretation: NOT DETECTED
C Diff toxin: NEGATIVE

## 2019-07-11 MED ORDER — DEXAMETHASONE SODIUM PHOSPHATE 10 MG/ML IJ SOLN
10.0000 mg | Freq: Once | INTRAMUSCULAR | Status: AC
  Administered 2019-07-11: 10 mg via INTRAVENOUS
  Filled 2019-07-11: qty 1

## 2019-07-11 MED ORDER — GADOBUTROL 1 MMOL/ML IV SOLN
6.0000 mL | Freq: Once | INTRAVENOUS | Status: AC | PRN
  Administered 2019-07-11: 12:00:00 6 mL via INTRAVENOUS

## 2019-07-11 MED ORDER — GADOBUTROL 1 MMOL/ML IV SOLN: 6.0000 mL | Freq: Once | INTRAVENOUS | Status: DC | PRN

## 2019-07-11 MED ORDER — MORPHINE SULFATE (PF) 2 MG/ML IV SOLN
1.0000 mg | INTRAVENOUS | Status: DC | PRN
  Administered 2019-07-11: 1 mg via INTRAVENOUS
  Filled 2019-07-11: qty 1

## 2019-07-11 MED ORDER — MAGNESIUM SULFATE 2 GM/50ML IV SOLN
2.0000 g | Freq: Once | INTRAVENOUS | Status: AC
  Administered 2019-07-11: 2 g via INTRAVENOUS
  Filled 2019-07-11: qty 50

## 2019-07-11 MED ORDER — OXYCODONE HCL 5 MG PO TABS
5.0000 mg | ORAL_TABLET | ORAL | Status: DC | PRN
  Administered 2019-07-11 (×2): 5 mg via ORAL
  Filled 2019-07-11 (×2): qty 1

## 2019-07-11 NOTE — Progress Notes (Signed)
Patient ID: Alex Curl., male   DOB: [birth year], 18 y.o.   MRN: [id number] [facility] PROGRESS NOTE  Alex Curl. [id number]:[id number] DOB: Jun 08, 2000 DOA: 07/10/2019 PCP: Therapy, [facility]  HPI/Subjective: Patient still complaining of headache.  He does feel little bit better than when he came in.  Patient having diarrhea and does not realize he is having it.  Still feels nauseous and unable to eat very much.  Light bothers his eyes.  Objective: Vitals:   07/10/19 2006 07/11/19 0356  BP: 132/84 133/89  Pulse: 92 (!) 106  Resp: 16   Temp: 98 F (36.7 C) (!) 97.5 F (36.4 C)  SpO2: 100% 100%    Intake/Output Summary (Last 24 hours) at 07/11/2019 1319 Last data filed at 07/11/2019 0300 Gross per 24 hour  Intake 920.99 ml  Output --  Net 920.99 ml   Filed Weights   07/10/19 0427  Weight: 65.8 kg    ROS: Review of Systems  Constitutional: Negative for chills and fever.  Eyes: Negative for blurred vision.  Respiratory: Negative for cough and shortness of breath.   Cardiovascular: Negative for chest pain.  Gastrointestinal: Positive for diarrhea and nausea. Negative for abdominal pain and vomiting.  Genitourinary: Negative for dysuria.  Musculoskeletal: Negative for joint pain.  Neurological: Positive for dizziness and headaches.   Exam: Physical Exam  Constitutional: He is oriented to person, place, and time.  HENT:  Nose: No mucosal edema.  Mouth/Throat: No oropharyngeal exudate or posterior oropharyngeal edema.  Eyes: Pupils are equal, round, and reactive to light. Conjunctivae and lids are normal.  I sensitive to light  Neck: Carotid bruit is not present.  Cardiovascular: S1 normal and S2 normal. Exam reveals no gallop.  No murmur heard. Respiratory: No respiratory distress. He has no wheezes. He has no rhonchi. He has no rales.  GI: Soft. Bowel sounds are normal. There is no abdominal tenderness.  Musculoskeletal:     Right ankle: No  swelling.     Left ankle: No swelling.  Lymphadenopathy:    He has no cervical adenopathy.  Neurological: He is alert and oriented to person, place, and time.  Skin: Skin is warm. No rash noted. Nails show no clubbing.  Psychiatric: He has a normal mood and affect.      Data Reviewed: Basic Metabolic Panel: Recent Labs  Lab 07/10/19 0432 07/11/19 0447  NA 135 138  K 3.4* 3.8  CL 104 105  CO2 24 22  GLUCOSE 118* 74  BUN 15 9  CREATININE 1.01 0.73  CALCIUM 9.2 8.7*  MG 2.2  --    CBC: Recent Labs  Lab 07/10/19 0432 07/11/19 0447  WBC 9.2 7.7  NEUTROABS 4.1  --   HGB 14.6 13.7  HCT 43.8 40.9  MCV 83.9 84.9  PLT 375 342     Recent Results (from the past 240 hour(s))  CSF culture     Status: None (Preliminary result)   Collection Time: 07/10/19  7:03 AM   Specimen: CSF; Cerebrospinal Fluid  Result Value Ref Range Status   Specimen Description   Final    CSF Performed at [facility], [address] [place], [place] [address]    Special Requests   Final    NONE Performed at [facility], [address] [place], [place] [address]    Gram Stain   Final    NO ORGANISMS SEEN WBC SEEN RED BLOOD CELLS SEEN Performed at [facility],  [address] [place], [place] [address]    Culture   Final    NO GROWTH < 24 HOURS Performed at [facility], [address], [place], [place] [address]    Report Status PENDING  Incomplete  SARS CORONAVIRUS 2 (TAT 6-24 HRS) Nasopharyngeal Nasopharyngeal Swab     Status: None   Collection Time: 07/10/19  8:52 AM   Specimen: Nasopharyngeal Swab  Result Value Ref Range Status   SARS Coronavirus 2 NEGATIVE NEGATIVE Final    Comment: (NOTE) SARS-CoV-2 target nucleic acids are NOT DETECTED. The SARS-CoV-2 RNA is generally detectable in upper and lower respiratory specimens during the acute phase of infection. Negative results do not preclude SARS-CoV-2 infection, do not rule  out co-infections with other pathogens, and should not be used as the sole basis for treatment or other patient management decisions. Negative results must be combined with clinical observations, patient history, and epidemiological information. The expected result is Negative. Fact Sheet for Patients: HairSlick.no Fact Sheet for Healthcare Providers: quierodirigir.com This test is not yet approved or cleared by the [place] FDA and  has been authorized for detection and/or diagnosis of SARS-CoV-2 by FDA under an Emergency Use Authorization (EUA). This EUA will remain  in effect (meaning this test can be used) for the duration of the COVID-19 declaration under Section 56 4(b)(1) of the Act, 21 U.S.C. section 360bbb-3(b)(1), unless the authorization is terminated or revoked sooner. Performed at [facility], [address], [place], [place] [address]      Studies: CT Head Wo Contrast  Result Date: 07/10/2019 CLINICAL DATA:  Larey Seat out of a car.  Headache. EXAM: CT HEAD WITHOUT CONTRAST TECHNIQUE: Contiguous axial images were obtained from the base of the skull through the vertex without intravenous contrast. COMPARISON:  08/30/2013. FINDINGS: Brain: The brain shows a normal appearance without evidence of malformation, atrophy, old or acute small or large vessel infarction, mass lesion, hemorrhage, hydrocephalus or extra-axial collection. Vascular: No hyperdense vessel. No evidence of atherosclerotic calcification. Skull: Normal.  No traumatic finding.  No focal bone lesion. Sinuses/Orbits: Sinuses are clear. Orbits appear normal. Mastoids are clear. Other: None significant IMPRESSION: Normal head CT Electronically Signed   By: Paulina Fusi M.D.   On: 07/10/2019 05:59   MR BRAIN W WO CONTRAST  Result Date: 07/11/2019 CLINICAL DATA:  Headache, neck pain, possible aseptic meningitis EXAM: MRI HEAD WITHOUT AND WITH CONTRAST  TECHNIQUE: Multiplanar, multiecho pulse sequences of the brain and surrounding structures were obtained without and with intravenous contrast. CONTRAST:  58mL GADAVIST GADOBUTROL 1 MMOL/ML IV SOLN COMPARISON:  None. FINDINGS: Motion artifact is present on some sequences. Brain: There is no acute infarction or intracranial hemorrhage. There is no intracranial mass, mass effect, or edema. There is no hydrocephalus or extra-axial fluid collection. No abnormal enhancement. Vascular: Major vessel flow voids at the skull base are preserved. Skull and upper cervical spine: Normal marrow signal is preserved. Sinuses/Orbits: Right maxillary sinus retention cyst or polyp. Orbits are unremarkable. Other: Sella is unremarkable.  Mastoid air cells are clear. IMPRESSION: No evidence of meningitis or encephalitis. No mass, infarction, or hemorrhage. Electronically Signed   By: Guadlupe Spanish M.D.   On: 07/11/2019 12:24    Scheduled Meds: . enoxaparin (LOVENOX) injection  40 mg Subcutaneous Q24H  . famotidine  20 mg Oral BID   Continuous Infusions: . 0.9 % NaCl with KCl 20 mEq / L 100 mL/hr at 07/11/19 0400    Assessment/Plan:  1.  Viral meningitis versus encephalitis.  Patient had welts with the acyclovir so this was discontinued.  Supportive care. 2. Headache.  We will give IV magnesium and IV Decadron and continue to monitor. 3. Diarrhea that is uncontrolled.  We will send off stool studies  Code Status:     Code Status Orders  (From admission, onward)         Start     Ordered   07/10/19 1019  Full code  Continuous     07/10/19 1020        Code Status History    This patient has a current code status but no historical code status.   Advance Care Planning Activity     Family Communication: Patient's mother at the bedside Disposition Plan: Evaluate on a daily basis on when to go home.  If he is feeling better tomorrow I will discharge home. Would like to see headache a little bit better.   Patient will also need to be able to eat better.  Consultants:  Case discussed over epic haiku with infectious disease  Time spent: 5 minutes  [place]

## 2019-07-11 NOTE — Plan of Care (Signed)

## 2019-07-12 DIAGNOSIS — E876 Hypokalemia: Secondary | ICD-10-CM

## 2019-07-12 LAB — BASIC METABOLIC PANEL
Anion gap: 5 (ref 5–15)
BUN: 9 mg/dL (ref 6–20)
CO2: 23 mmol/L (ref 22–32)
Calcium: 8.4 mg/dL — ABNORMAL LOW (ref 8.9–10.3)
Chloride: 108 mmol/L (ref 98–111)
Creatinine, Ser: 0.74 mg/dL (ref 0.61–1.24)
GFR calc Af Amer: >60 mL/min (ref >60)
GFR calc non Af Amer: >60 mL/min (ref >60)
Glucose, Bld: 106 mg/dL — ABNORMAL HIGH (ref 70–99)
Potassium: 4.2 mmol/L (ref 3.5–5.1)
Sodium: 136 mmol/L (ref 135–145)

## 2019-07-12 LAB — CBC
HCT: 42.4 % (ref 39.0–52.0)
Hemoglobin: 14 g/dL (ref 13.0–17.0)
MCH: 27.9 pg (ref 26.0–34.0)
MCHC: 33 g/dL (ref 30.0–36.0)
MCV: 84.6 fL (ref 80.0–100.0)
Platelets: 415 10*3/uL — ABNORMAL HIGH (ref 150–400)
RBC: 5.01 MIL/uL (ref 4.22–5.81)
RDW: 12.1 % (ref 11.5–15.5)
WBC: 12.3 10*3/uL — ABNORMAL HIGH (ref 4.0–10.5)
nRBC: 0 % (ref 0.0–0.2)

## 2019-07-12 MED ORDER — IBUPROFEN 800 MG PO TABS: 800.0000 mg | ORAL_TABLET | Freq: Three times a day (TID) | ORAL | 0 refills | Status: DC | PRN

## 2019-07-12 MED ORDER — ONDANSETRON HCL 4 MG PO TABS: 4.0000 mg | ORAL_TABLET | Freq: Four times a day (QID) | ORAL | 0 refills | Status: DC | PRN

## 2019-07-12 MED ORDER — DEXAMETHASONE SODIUM PHOSPHATE 10 MG/ML IJ SOLN
10.0000 mg | Freq: Once | INTRAMUSCULAR | Status: AC
  Administered 2019-07-12: 10 mg via INTRAVENOUS
  Filled 2019-07-12: qty 1

## 2019-07-12 MED ORDER — MAGNESIUM SULFATE 2 GM/50ML IV SOLN
2.0000 g | Freq: Once | INTRAVENOUS | Status: AC
  Administered 2019-07-12: 2 g via INTRAVENOUS
  Filled 2019-07-12: qty 50

## 2019-07-12 MED ORDER — KETOROLAC TROMETHAMINE 15 MG/ML IJ SOLN
15.0000 mg | Freq: Once | INTRAMUSCULAR | Status: AC
  Administered 2019-07-12: 15 mg via INTRAVENOUS
  Filled 2019-07-12: qty 1

## 2019-07-12 NOTE — Plan of Care (Signed)
The patient has been discharged. Education has been performed and teach back method utilized.

## 2019-07-12 NOTE — Discharge Summary (Signed)
[place] [facility] [place] NAME: Alex Hudson    MR#:  [id number]  DATE OF BIRTH:  01-28-2001  DATE OF ADMISSION:  07/10/2019 ADMITTING PHYSICIAN: Loletha Grayer, MD  DATE OF DISCHARGE: 07/12/2019 11:51 AM  PRIMARY CARE PHYSICIAN: Therapy, [facility]    ADMISSION DIAGNOSIS:  Acute nonintractable headache, unspecified headache type [R51.9] Viral meningitis, unspecified [A87.9] Viral meningitis [A87.9]  DISCHARGE DIAGNOSIS:  Principal Problem:   Viral meningitis, unspecified Active Problems:   Hypokalemia   Migraine headache   Viral meningitis   Acute nonintractable headache   Diarrhea   SECONDARY DIAGNOSIS:   Past Medical History:  Diagnosis Date  . Ventricular arrhythmia    RIGHT    HOSPITAL COURSE:   1.  Viral meningitis versus encephalitis.  The patient was given supportive care here in the hospital.  The patient had welts to the acyclovir so this was discontinued.  The patient was feeling much better on 07/12/2019 and discharged home.  MRI of the brain negative.  Note for out of work for 1 week from today. 2.  Headache.  The patient was given IV magnesium and Decadron and Toradol on 07/11/19 and again on 07/12/19.  Patient was headache free upon discharge. 3.  Diarrhea that was uncontrolled.  Stool for C. difficile negative.  Stool for comprehensive panel pending.  Patient states that the diarrhea has also subsided. 4.  Hypokalemia this was replaced during the hospital course  DISCHARGE CONDITIONS:   Satisfactory  CONSULTS OBTAINED:  Case discussed with Dr. Graylon Good infectious disease over at [place]:   Allergies  Allergen Reactions  . Augmentin [Amoxicillin-Pot Clavulanate] Diarrhea    DISCHARGE MEDICATIONS:   Allergies as of 07/12/2019      Reactions   Augmentin [amoxicillin-pot Clavulanate] Diarrhea      Medication List    STOP taking these medications   meloxicam 15 MG  tablet Commonly known as: MOBIC     TAKE these medications   ibuprofen 800 MG tablet Commonly known as: ADVIL Take 1 tablet (800 mg total) by mouth every 8 (eight) hours as needed.   ondansetron 4 MG tablet Commonly known as: ZOFRAN Take 1 tablet (4 mg total) by mouth every 6 (six) hours as needed for nausea.        DISCHARGE INSTRUCTIONS:   Follow-up with PMD 5 days  If you experience worsening of your admission symptoms, develop shortness of breath, life threatening emergency, suicidal or homicidal thoughts you must seek medical attention immediately by calling 911 or calling your MD immediately  if symptoms less severe.  You Must read complete instructions/literature along with all the possible adverse reactions/side effects for all the Medicines you take and that have been prescribed to you. Take any new Medicines after you have completely understood and accept all the possible adverse reactions/side effects.   Please note  You were cared for by a hospitalist during your hospital stay. If you have any questions about your discharge medications or the care you received while you were in the hospital after you are discharged, you can call the unit and asked to speak with the hospitalist on call if the hospitalist that took care of you is not available. Once you are discharged, your primary care physician will handle any further medical issues. Please note that NO REFILLS for any discharge medications will be authorized once you are discharged, as it is imperative that you return to your primary care  physician (or establish a relationship with a primary care physician if you do not have one) for your aftercare needs so that they can reassess your need for medications and monitor your lab values.    Today   CHIEF COMPLAINT:   Chief Complaint  Patient presents with  . Headache    HISTORY OF PRESENT ILLNESS:  Alex Hudson  is a 19 y.o. male came in with headache, light  sensitivity and altered mental status   VITAL SIGNS:  Blood pressure 125/79, pulse 77, temperature 98 F (36.7 C), temperature source Oral, resp. rate 17, height 5\' 9"  (1.753 m), weight 65.8 kg, SpO2 100 %.  I/O:    Intake/Output Summary (Last 24 hours) at 07/12/2019 1454 Last data filed at 07/11/2019 2302 Gross per 24 hour  Intake 2094.26 ml  Output --  Net 2094.26 ml    PHYSICAL EXAMINATION:  GENERAL:  19 y.o.-year-old patient lying in the bed with no acute distress.  EYES: Pupils equal, round, reactive to light and accommodation. No scleral icterus.  I still sensitive to light but less sensitive than yesterday. HEENT: Head atraumatic, normocephalic. LUNGS: Normal breath sounds bilaterally, no wheezing, rales,rhonchi or crepitation. No use of accessory muscles of respiration.  CARDIOVASCULAR: S1, S2 normal. No murmurs, rubs, or gallops.  ABDOMEN: Soft, non-tender, non-distended.  EXTREMITIES: No pedal edema.  NEUROLOGIC: Cranial nerves II through XII are intact. Muscle strength 5/5 in all extremities. PSYCHIATRIC: The patient is alert and oriented x 3.  SKIN: No obvious rash, lesion, or ulcer.   DATA REVIEW:   CBC Recent Labs  Lab 07/12/19 0613  WBC 12.3*  HGB 14.0  HCT 42.4  PLT 415*    Chemistries  Recent Labs  Lab 07/10/19 0432 07/11/19 0447 07/12/19 0613  NA 135   < > 136  K 3.4*   < > 4.2  CL 104   < > 108  CO2 24   < > 23  GLUCOSE 118*   < > 106*  BUN 15   < > 9  CREATININE 1.01   < > 0.74  CALCIUM 9.2   < > 8.4*  MG 2.2  --   --    < > = values in this interval not displayed.     Microbiology Results  Results for orders placed or performed during the hospital encounter of 07/10/19  CSF culture     Status: None (Preliminary result)   Collection Time: 07/10/19  7:03 AM   Specimen: CSF; Cerebrospinal Fluid  Result Value Ref Range Status   Specimen Description   Final    CSF Performed at [facility], [address] [place], [place]  [place]    Special Requests   Final    NONE Performed at [facility], [address] [place], [place]    Gram Stain   Final    NO ORGANISMS SEEN WBC SEEN RED BLOOD CELLS SEEN Performed at [facility], [address], [place], [place]    Culture   Final    NO GROWTH 2 DAYS Performed at [facility], [address], [place], [place]    Report Status PENDING  Incomplete  SARS CORONAVIRUS 2 (TAT 6-24 HRS) Nasopharyngeal Nasopharyngeal Swab     Status: None   Collection Time: 07/10/19  8:52 AM   Specimen: Nasopharyngeal Swab  Result Value Ref Range Status   SARS Coronavirus 2 NEGATIVE NEGATIVE Final    Comment: (NOTE) SARS-CoV-2  target nucleic acids are NOT DETECTED. The SARS-CoV-2 RNA is generally detectable in upper and lower respiratory specimens during the acute phase of infection. Negative results do not preclude SARS-CoV-2 infection, do not rule out co-infections with other pathogens, and should not be used as the sole basis for treatment or other patient management decisions. Negative results must be combined with clinical observations, patient history, and epidemiological information. The expected result is Negative. Fact Sheet for Patients: HairSlick.no Fact Sheet for Healthcare Providers: quierodirigir.com This test is not yet approved or cleared by the [place] FDA and  has been authorized for detection and/or diagnosis of SARS-CoV-2 by FDA under an Emergency Use Authorization (EUA). This EUA will remain  in effect (meaning this test can be used) for the duration of the COVID-19 declaration under Section 56 4(b)(1) of the Act, 21 U.S.C. section 360bbb-3(b)(1), unless the authorization is terminated or revoked sooner. Performed at [facility], [address], [place], [place] [address]   C difficile quick scan w PCR reflex     Status: None    Collection Time: 07/11/19 10:39 AM   Specimen: Stool  Result Value Ref Range Status   C Diff antigen NEGATIVE NEGATIVE Final   C Diff toxin NEGATIVE NEGATIVE Final   C Diff interpretation No C. difficile detected.  Final    Comment: Performed at [facility], [address] [place] [place], [place] [address]    RADIOLOGY:  MR BRAIN W WO CONTRAST  Result Date: 07/11/2019 CLINICAL DATA:  Headache, neck pain, possible aseptic meningitis EXAM: MRI HEAD WITHOUT AND WITH CONTRAST TECHNIQUE: Multiplanar, multiecho pulse sequences of the brain and surrounding structures were obtained without and with intravenous contrast. CONTRAST:  68mL GADAVIST GADOBUTROL 1 MMOL/ML IV SOLN COMPARISON:  None. FINDINGS: Motion artifact is present on some sequences. Brain: There is no acute infarction or intracranial hemorrhage. There is no intracranial mass, mass effect, or edema. There is no hydrocephalus or extra-axial fluid collection. No abnormal enhancement. Vascular: Major vessel flow voids at the skull base are preserved. Skull and upper cervical spine: Normal marrow signal is preserved. Sinuses/Orbits: Right maxillary sinus retention cyst or polyp. Orbits are unremarkable. Other: Sella is unremarkable.  Mastoid air cells are clear. IMPRESSION: No evidence of meningitis or encephalitis. No mass, infarction, or hemorrhage. Electronically Signed   By: Guadlupe Spanish M.D.   On: 07/11/2019 12:24     Management plans discussed with the patient, family and they are in agreement.  CODE STATUS:     Code Status Orders  (From admission, onward)         Start     Ordered   07/10/19 1019  Full code  Continuous     07/10/19 1020        Code Status History    This patient has a current code status but no historical code status.   Advance Care Planning Activity      TOTAL TIME TAKING CARE OF THIS PATIENT: 32 minutes.    [name] M.D on 07/12/2019 at 2:54 PM  Between 7am to 6pm - Pager -  [phone]  After 6pm go to [email or web address] - password [email or web address]  [facility]  CC: Primary care physician; Therapy, [facility]

## 2019-07-12 NOTE — Progress Notes (Signed)
Patient has been resting well throughout the night. He has had no complaints of HA since the start to my shift.

## 2019-07-12 NOTE — Progress Notes (Signed)
[facility] - [place] at [facility]        Alex Hudson was admitted to the Hospital on 07/10/2019 and Discharged  07/12/2019 and should be excused from work/school   for 9 days starting 07/10/2019 , may return to work/school on 07/20/2019 without any restrictions.  [name] M.D on 07/12/2019,at 9:54 AM  [facility] - [place] at [facility]

## 2019-07-13 LAB — CSF CULTURE W GRAM STAIN
Culture: NO GROWTH
Gram Stain: NONE SEEN

## 2019-07-13 LAB — ENTEROVIRUS PCR

## 2019-07-13 LAB — CSF CULTURE

## 2019-07-14 LAB — GI PATHOGEN PANEL BY PCR, STOOL

## 2019-07-14 LAB — ENTEROVIRUS PCR: Enterovirus PCR: NEGATIVE

## 2019-07-20 ENCOUNTER — Telehealth: Payer: Self-pay | Admitting: Medical Oncology

## 2019-07-20 NOTE — Telephone Encounter (Signed)
This RN got a call from lab about pts CSF. The tube was too short to run IGG/IGm test. This RN talked to Dr Derrill Kay about this to see if there was a recommendation. It was recommended that the patient call his PCP/Neurologist to see if it is necessary to have this test resulted. This RN called the patient based off of recommendation of Dr Derrill Kay, spoke with pts mother and told her this. Pt will call tomorrow to his PCP. Mother was appreciative of info.

## 2019-07-21 LAB — HSV 1/2 AB IGG/IGM CSF: HSV 1/2 Ab, IgM, CSF: UNDETERMINED IV

## 2019-11-30 ENCOUNTER — Ambulatory Visit
Admission: EM | Admit: 2019-11-30 | Discharge: 2019-11-30 | Disposition: A | Discharge status: Home / Self Care | Payer: Medicaid Other | Attending: Family Medicine | Admitting: Family Medicine

## 2019-11-30 ENCOUNTER — Other Ambulatory Visit: Payer: Self-pay

## 2019-11-30 DIAGNOSIS — M25562 Pain in left knee: Secondary | ICD-10-CM

## 2019-11-30 MED ORDER — MELOXICAM 15 MG PO TABS: 15.0000 mg | ORAL_TABLET | Freq: Every day | ORAL | 0 refills | Status: DC | PRN

## 2019-11-30 NOTE — Discharge Instructions (Signed)
Medication as prescribed.  You need to see an orthopedist as soon as possible. Please call [facility] clinic Orthopedics [phone]) OR [facility] [phone]) for an appt.  Take care  Dr. Adriana Simas

## 2019-11-30 NOTE — ED Triage Notes (Signed)
Patient states that he was running out of a parking lot on Sunday evening and hit a dip in the road and knee gave out on him. Reports that he tried to walk across the street and knee gave out again. Patient states that knee has been swollen and achy.

## 2019-11-30 NOTE — ED Provider Notes (Signed)
[facility]    CSN: [id number] Arrival date & time: 11/30/19  1448  History   Chief Complaint Chief Complaint  Patient presents with  . Knee Pain    left   HPI  19 year old male presents with left knee pain.  Patient states that he was running on Sunday and felt like his knee was going to give out on him.  He states that he encountered some dips in the road but does not recall any discrete twisting injury.  Patient states that he has had difficulty going up and down stairs since that time.  He reports that he feels like his knee is going to give way.  Pain 6/10 in severity.  He reports prior injuries to the knees.  He reports swelling but states that the swelling has improved as of today.  No relieving factors.  He is able to ambulate.  No other associated symptoms.  No other complaints.  Past Medical History:  Diagnosis Date  . Ventricular arrhythmia    RIGHT   Patient Active Problem List   Diagnosis Date Noted  . Viral meningitis 07/11/2019  . Acute nonintractable headache   . Diarrhea   . Viral meningitis, unspecified 07/10/2019  . Hypokalemia 07/10/2019  . Migraine headache 07/10/2019   Past Surgical History:  Procedure Laterality Date  . RIGHT HEART CATH    . TONSILLECTOMY     Home Medications    Prior to Admission medications   Medication Sig Start Date End Date Taking? Authorizing Provider  meloxicam (MOBIC) 15 MG tablet Take 1 tablet (15 mg total) by mouth daily as needed for pain. 11/30/19   Tommie Sams, DO  cetirizine (ZYRTEC) 10 MG tablet Take 10 mg by mouth at bedtime as needed for allergies. 09/10/17 11/14/18  [provider]  fluticasone (FLONASE) 50 MCG/ACT nasal spray Place 1 spray into both nostrils daily.  11/14/18  [provider]    Family History Family History  Problem Relation Age of Onset  . Hypertension Mother   . Hypertension Father     Social History Social History   Tobacco Use  . Smoking status: Never  Smoker  . Smokeless tobacco: Never Used  Vaping Use  . Vaping Use: Former  Substance Use Topics  . Alcohol use: No  . Drug use: Never     Allergies   Augmentin [amoxicillin-pot clavulanate]   Review of Systems Review of Systems  Constitutional: Negative.   Musculoskeletal:       Left knee pain and swelling.   Physical Exam Triage Vital Signs ED Triage Vitals  Enc Vitals Group     BP 11/30/19 1603 119/76     Pulse Rate 11/30/19 1603 72     Resp 11/30/19 1603 18     Temp 11/30/19 1603 98.2 F (36.8 C)     Temp Source 11/30/19 1603 Oral     SpO2 11/30/19 1603 100 %     Weight 11/30/19 1601 155 lb (70.3 kg)     Height 11/30/19 1601 5\' 9"  (1.753 m)     Head Circumference --      Peak Flow --      Pain Score 11/30/19 1601 6     Pain Loc --      Pain Edu? --      Excl. in GC? --    Updated Vital Signs BP 119/76 (BP Location: Right Arm)   Pulse 72   Temp 98.2 F (36.8 C) (Oral)   Resp  18   Ht 5\' 9"  (1.753 m)   Wt 70.3 kg   SpO2 100%   BMI 22.89 kg/m   Visual Acuity Right Eye Distance:   Left Eye Distance:   Bilateral Distance:    Right Eye Near:   Left Eye Near:    Bilateral Near:     Physical Exam Constitutional:      General: He is not in acute distress.    Appearance: Normal appearance. He is not ill-appearing.  HENT:     Head: Normocephalic and atraumatic.  Eyes:     General:        Right eye: No discharge.        Left eye: No discharge.     Conjunctiva/sclera: Conjunctivae normal.  Pulmonary:     Effort: Pulmonary effort is normal. No respiratory distress.  Musculoskeletal:     Comments: Left knee: Patient endorsing anterior joint line tenderness.  Ligaments intact.  No other discrete areas of tenderness.  No apparent effusion.  Neurological:     Mental Status: He is alert.  Psychiatric:        Mood and Affect: Mood normal.        Behavior: Behavior normal.    UC Treatments / Results  Labs (all labs ordered are listed, but only  abnormal results are displayed) Labs Reviewed - No data to display  EKG   Radiology No results found.  Procedures Procedures (including critical care time)  Medications Ordered in UC Medications - No data to display  Initial Impression / Assessment and Plan / UC Course  I have reviewed the triage vital signs and the nursing notes.  Pertinent labs & imaging results that were available during my care of the patient were reviewed by me and considered in my medical decision making (see chart for details).    19 year old male presents with acute left knee pain.  Concern for possible meniscal or ligamentous injury.  Meloxicam as directed.  Rest, ice, elevation.  Advised to see orthopedics.  Final Clinical Impressions(s) / UC Diagnoses   Final diagnoses:  Acute pain of left knee     Discharge Instructions     Medication as prescribed.  You need to see an orthopedist as soon as possible. Please call [facility] clinic Orthopedics [phone]) OR [facility] [phone]) for an appt.  Take care  Dr. ([phone]    ED Prescriptions    Medication Sig Dispense Auth. Provider   meloxicam (MOBIC) 15 MG tablet Take 1 tablet (15 mg total) by mouth daily as needed for pain. 30 tablet Adriana Simas, DO     PDMP not reviewed this encounter.   Tommie Sams, Tommie Sams 11/30/19 1702

## 2020-01-07 ENCOUNTER — Other Ambulatory Visit: Payer: Self-pay

## 2020-01-07 ENCOUNTER — Emergency Department
Admission: EM | Admit: 2020-01-07 | Discharge: 2020-01-07 | Disposition: A | Discharge status: Home / Self Care | Payer: Medicaid Other | Attending: Emergency Medicine | Admitting: Emergency Medicine

## 2020-01-07 DIAGNOSIS — Z20822 Contact with and (suspected) exposure to covid-19: Secondary | ICD-10-CM | POA: Diagnosis not present

## 2020-01-07 DIAGNOSIS — R0602 Shortness of breath: Secondary | ICD-10-CM | POA: Insufficient documentation

## 2020-01-07 DIAGNOSIS — B349 Viral infection, unspecified: Secondary | ICD-10-CM | POA: Insufficient documentation

## 2020-01-07 DIAGNOSIS — F17200 Nicotine dependence, unspecified, uncomplicated: Secondary | ICD-10-CM | POA: Insufficient documentation

## 2020-01-07 DIAGNOSIS — Z79899 Other long term (current) drug therapy: Secondary | ICD-10-CM | POA: Insufficient documentation

## 2020-01-07 DIAGNOSIS — R05 Cough: Secondary | ICD-10-CM | POA: Diagnosis present

## 2020-01-07 DIAGNOSIS — R519 Headache, unspecified: Secondary | ICD-10-CM | POA: Insufficient documentation

## 2020-01-07 LAB — RESPIRATORY PANEL BY RT PCR (FLU A&B, COVID)
Influenza A by PCR: NEGATIVE
Influenza B by PCR: NEGATIVE
SARS Coronavirus 2 by RT PCR: NEGATIVE

## 2020-01-07 MED ORDER — PSEUDOEPH-BROMPHEN-DM 30-2-10 MG/5ML PO SYRP: 5.0000 mL | ORAL_SOLUTION | Freq: Four times a day (QID) | ORAL | 0 refills | Status: DC | PRN

## 2020-01-07 NOTE — ED Notes (Signed)
Moved to triage 4 for discharge

## 2020-01-07 NOTE — ED Provider Notes (Signed)
[facility] Emergency Department Provider Note  ____________________________________________  Time seen: Approximately 8:12 PM  I have reviewed the triage vital signs and the nursing notes.   HISTORY  Chief Complaint Covid Sx   HPI Alex Hudson. is a 19 y.o. male presents to the emergency department for treatment and evaluation of shortness of breath, headache, cough, and occasional shortness of breath. Some relief with OTC medications. No known COVID exposure.    Past Medical History:  Diagnosis Date  . Ventricular arrhythmia    RIGHT    Patient Active Problem List   Diagnosis Date Noted  . Viral meningitis 07/11/2019  . Acute nonintractable headache   . Diarrhea   . Viral meningitis, unspecified 07/10/2019  . Hypokalemia 07/10/2019  . Migraine headache 07/10/2019    Past Surgical History:  Procedure Laterality Date  . RIGHT HEART CATH    . TONSILLECTOMY      Prior to Admission medications   Medication Sig Start Date End Date Taking? Authorizing Provider  brompheniramine-pseudoephedrine-DM 30-2-10 MG/5ML syrup Take 5 mLs by mouth 4 (four) times daily as needed. 01/07/20   Pluma Diniz, Rulon Eisenmenger B, FNP  meloxicam (MOBIC) 15 MG tablet Take 1 tablet (15 mg total) by mouth daily as needed for pain. 11/30/19   Tommie Sams, DO  cetirizine (ZYRTEC) 10 MG tablet Take 10 mg by mouth at bedtime as needed for allergies. 09/10/17 11/14/18  [provider]  fluticasone (FLONASE) 50 MCG/ACT nasal spray Place 1 spray into both nostrils daily.  11/14/18  [provider]    Allergies Augmentin [amoxicillin-pot clavulanate]  Family History  Problem Relation Age of Onset  . Hypertension Mother   . Hypertension Father     Social History Social History   Tobacco Use  . Smoking status: Current Every Day Smoker  . Smokeless tobacco: Never Used  Vaping Use  . Vaping Use: Former  Substance Use Topics  . Alcohol use: No  . Drug use: Never     Review of Systems Constitutional: Positive fever/chills. Well appetite. ENT: Negative sore throat. Cardiovascular: Denies chest pain. Respiratory: Occasional shortness of breath. Positive for cough. Negative for wheezing.  Gastrointestinal: No nausea,  no vomiting.  no diarrhea.  Musculoskeletal: Positive for body aches Skin: Negataive for rash. Neurological: Positive for headaches ____________________________________________   PHYSICAL EXAM:  VITAL SIGNS: ED Triage Vitals  Enc Vitals Group     BP 01/07/20 1625 139/75     Pulse Rate 01/07/20 1625 84     Resp 01/07/20 1625 18     Temp 01/07/20 1625 97.8 F (36.6 C)     Temp Source 01/07/20 1625 Oral     SpO2 01/07/20 1625 99 %     Weight 01/07/20 1627 161 lb (73 kg)     Height 01/07/20 1627 5\' 9"  (1.753 m)     Head Circumference --      Peak Flow --      Pain Score 01/07/20 1626 3     Pain Loc --      Pain Edu? --      Excl. in GC? --     Constitutional: Alert and oriented. Overall well appearing and in no acute distress. Eyes: Conjunctivae are normal. Ears: TM normal Nose: Maxillary sinus congestion noted; no rhinnorhea. Mouth/Throat: Mucous membranes are moist.  Oropharynx clear. Tonsils normal. Uvula midline. Neck: No stridor.  Lymphatic: No cervical lymphadenopathy. Cardiovascular: Normal rate, regular rhythm. Good peripheral circulation. Respiratory: Respirations are even and unlabored.  No retractions. Breath sounds clear. Gastrointestinal: Soft and nontender.  Musculoskeletal: FROM x 4 extremities.  Neurologic:  Normal speech and language. Skin:  Skin is warm, dry and intact. No rash noted. Psychiatric: Mood and affect are normal. Speech and behavior are normal.  ____________________________________________   LABS (all labs ordered are listed, but only abnormal results are displayed)  Labs Reviewed  RESPIRATORY PANEL BY RT PCR (FLU A&B, COVID)    ____________________________________________  EKG  Not indicated. ____________________________________________  RADIOLOGY  Not indicated.  ____________________________________________   PROCEDURES  Procedure(s) performed: None  Critical Care performed: No ____________________________________________   INITIAL IMPRESSION / ASSESSMENT AND PLAN / ED COURSE  19 y.o. male presents for COVID screening. See HPI for further details. Will test and have him check online for results. He was advised to return to the ER for symptoms that change or worsen or for new concerns if unable to schedule an appointment.    Medications - No data to display  ED Discharge Orders         Ordered    brompheniramine-pseudoephedrine-DM 30-2-10 MG/5ML syrup  4 times daily PRN        01/07/20 1644           Pertinent labs & imaging results that were available during my care of the patient were reviewed by me and considered in my medical decision making (see chart for details).    If controlled substance prescribed during this visit, 12 month history viewed on the [facility] prior to issuing an initial prescription for Schedule II or III opiod. ____________________________________________   FINAL CLINICAL IMPRESSION(S) / ED DIAGNOSES  Final diagnoses:  Viral syndrome  Encounter for laboratory testing for COVID-19 virus    Note:  This document was prepared using Dragon voice recognition software and may include unintentional dictation errors.    Chinita Pester, FNP 01/07/20 2018    Minna Antis, MD 01/07/20 2022

## 2020-01-07 NOTE — ED Notes (Signed)
Pt verbalizes understanding of d/c instructions, medications and follow up 

## 2020-01-07 NOTE — ED Triage Notes (Addendum)
PT to ED for covid test. Has been having mild SHOB, felt like his eyes were bulging, and headaches. Sx have improved since but still slightly remain. PT walking and talking with no difficulty noted.

## 2020-01-23 ENCOUNTER — Emergency Department: Payer: Medicaid Other

## 2020-01-23 ENCOUNTER — Other Ambulatory Visit: Payer: Self-pay

## 2020-01-23 DIAGNOSIS — M25562 Pain in left knee: Secondary | ICD-10-CM | POA: Diagnosis not present

## 2020-01-23 DIAGNOSIS — Z5321 Procedure and treatment not carried out due to patient leaving prior to being seen by health care provider: Secondary | ICD-10-CM | POA: Insufficient documentation

## 2020-01-23 NOTE — ED Triage Notes (Signed)
Patient reports he was running and his left knee buckled and he fell several weeks ago.  Reports continued pain to left knee.

## 2020-01-24 ENCOUNTER — Emergency Department
Admission: EM | Admit: 2020-01-24 | Discharge: 2020-01-24 | Disposition: A | Discharge status: Home / Self Care | Payer: Medicaid Other | Attending: Emergency Medicine | Admitting: Emergency Medicine

## 2020-07-14 IMAGING — CT CT ANGIO NECK
1 of 11 series · 6 of 33 positions shown · IV contrast (APPLIED)
Comparison: 08/30/2013 CT head.

CLINICAL DATA: 17 y/o M; migraine headache, neck pain on flexion,
left-sided rib.

EXAM:
CT ANGIOGRAPHY HEAD AND NECK
TECHNIQUE: Multidetector CT imaging of the head and neck was performed using
the standard protocol during bolus administration of intravenous
contrast. Multiplanar CT image reconstructions and MIPs were
obtained to evaluate the vascular anatomy. Carotid stenosis
measurements (when applicable) are obtained utilizing NASCET
criteria, using the distal internal carotid diameter as the
denominator.
CONTRAST:  75 cc Omnipaque 350

[Series 10: ax thin · axial · 0.42mm/px · z∈[-281,-8]mm · 6 of 383 slices shown]
[im 55/383  soft-tissue]
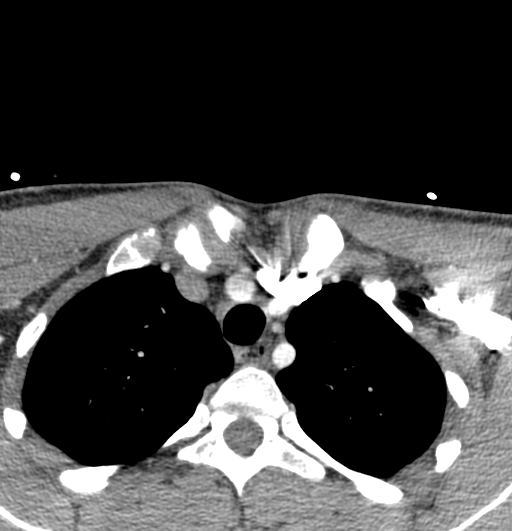
[im 110/383  bone]
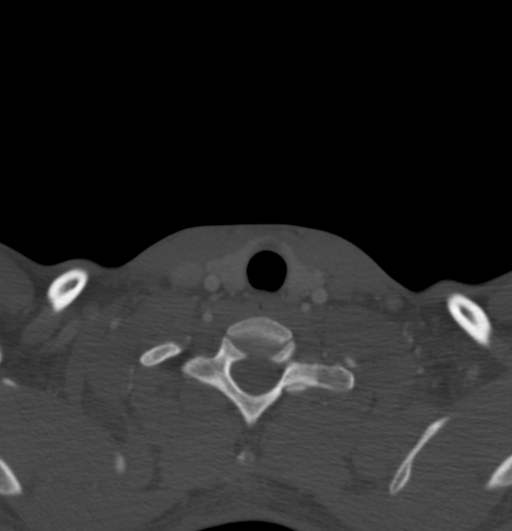
[im 164/383  soft-tissue]
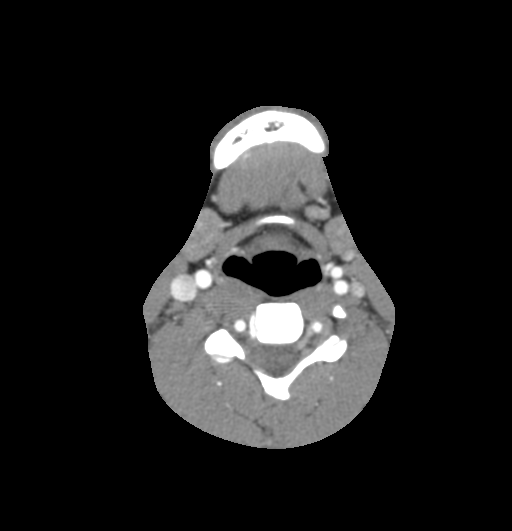
[im 219/383  bone]
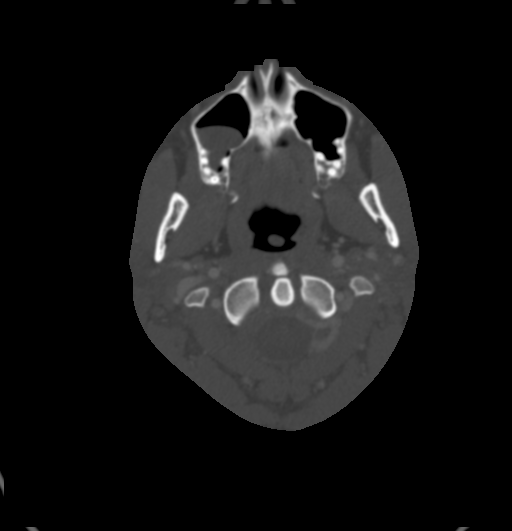
[im 273/383  soft-tissue]
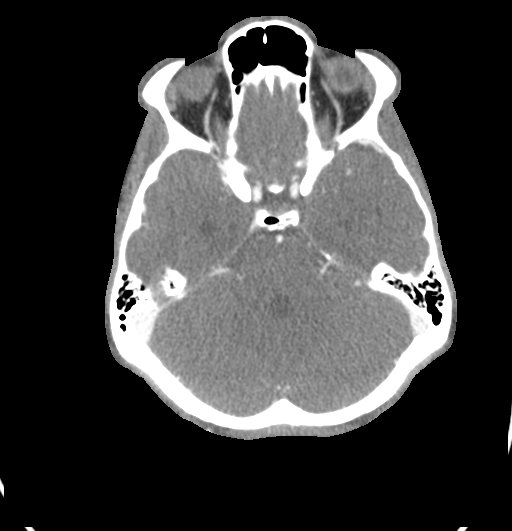
[im 328/383  bone]
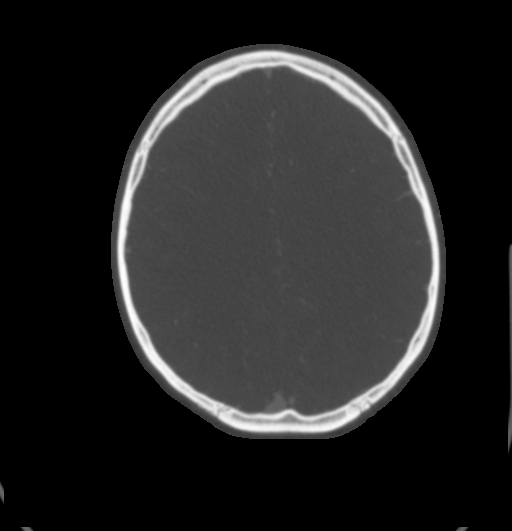

[6 of 33 positions shown; findings below may reference images not displayed]

FINDINGS: CT HEAD FINDINGS

Brain: No evidence of acute infarction, hemorrhage, hydrocephalus,
extra-axial collection or mass lesion/mass effect.

Vascular: No hyperdense vessel or unexpected calcification.

Skull: Normal. Negative for fracture or focal lesion.

Sinuses: Large right maxillary sinus mucous retention cyst.
Additional paranasal sinuses and the mastoid air cells are normally
aerated.

Orbits: No acute finding.

Review of the MIP images confirms the above findings

CTA NECK FINDINGS

Aortic arch: Four-vessel variant branching. Imaged portion shows no
evidence of aneurysm or dissection. No significant stenosis of the
major arch vessel origins.

Right carotid system: No evidence of dissection, stenosis (50% or
greater) or occlusion.

Left carotid system: No evidence of dissection, stenosis (50% or
greater) or occlusion.

Vertebral arteries: Right dominant. No evidence of dissection,
stenosis (50% or greater) or occlusion.

Skeleton: Negative.

Other neck: Stable mild prominence of upper cervical and left
submandibular lymph nodes, likely reactive. Clear lung apices.
Normal thyroid gland.

Upper chest: Negative.

Review of the MIP images confirms the above findings

CTA HEAD FINDINGS

Anterior circulation: No significant stenosis, proximal occlusion,
aneurysm, or vascular malformation.

Posterior circulation: No significant stenosis, proximal occlusion,
aneurysm, or vascular malformation.

Venous sinuses: As permitted by contrast timing, patent.

Anatomic variants: Complete circle-of-Willis.

Delayed phase: No abnormal intracranial enhancement.

Review of the MIP images confirms the above findings
IMPRESSION: Normal CT angiogram of the head and neck.

By: Venkata Fergus M.D.

## 2021-08-21 ENCOUNTER — Emergency Department
Admission: EM | Admit: 2021-08-21 | Discharge: 2021-08-21 | Discharge status: Against Medical Advice | Payer: Medicaid Other | Source: Home / Self Care

## 2021-09-08 ENCOUNTER — Encounter: Payer: Self-pay | Admitting: Emergency Medicine

## 2021-09-08 ENCOUNTER — Emergency Department: Payer: Medicaid Other

## 2021-09-08 ENCOUNTER — Other Ambulatory Visit: Payer: Self-pay

## 2021-09-08 ENCOUNTER — Emergency Department
Admission: EM | Admit: 2021-09-08 | Discharge: 2021-09-09 | Disposition: A | Discharge status: Home / Self Care | Payer: Medicaid Other | Attending: Emergency Medicine | Admitting: Emergency Medicine

## 2021-09-08 DIAGNOSIS — R002 Palpitations: Secondary | ICD-10-CM | POA: Diagnosis not present

## 2021-09-08 DIAGNOSIS — Z20822 Contact with and (suspected) exposure to covid-19: Secondary | ICD-10-CM | POA: Insufficient documentation

## 2021-09-08 DIAGNOSIS — R0609 Other forms of dyspnea: Secondary | ICD-10-CM | POA: Insufficient documentation

## 2021-09-08 LAB — CBC
HCT: 44.7 % (ref 39.0–52.0)
Hemoglobin: 14.4 g/dL (ref 13.0–17.0)
MCH: 27.2 pg (ref 26.0–34.0)
MCHC: 32.2 g/dL (ref 30.0–36.0)
MCV: 84.3 fL (ref 80.0–100.0)
Platelets: 430 10*3/uL — ABNORMAL HIGH (ref 150–400)
RBC: 5.3 MIL/uL (ref 4.22–5.81)
RDW: 12.5 % (ref 11.5–15.5)
WBC: 8 10*3/uL (ref 4.0–10.5)
nRBC: 0 % (ref 0.0–0.2)

## 2021-09-08 LAB — BASIC METABOLIC PANEL
Anion gap: 7 (ref 5–15)
BUN: 16 mg/dL (ref 6–20)
CO2: 27 mmol/L (ref 22–32)
Calcium: 9.3 mg/dL (ref 8.9–10.3)
Chloride: 105 mmol/L (ref 98–111)
Creatinine, Ser: 0.97 mg/dL (ref 0.61–1.24)
GFR, Estimated: >60 mL/min (ref >60)
Glucose, Bld: 98 mg/dL (ref 70–99)
Potassium: 3.8 mmol/L (ref 3.5–5.1)
Sodium: 139 mmol/L (ref 135–145)

## 2021-09-08 LAB — TROPONIN I (HIGH SENSITIVITY): Troponin I (High Sensitivity): 2 ng/L (ref <18)

## 2021-09-08 LAB — D-DIMER, QUANTITATIVE: D-Dimer, Quant: <0.27 ug/mL-FEU (ref 0.00–0.50)

## 2021-09-08 NOTE — ED Notes (Signed)
PT NSR ON MONITOR ?

## 2021-09-08 NOTE — ED Notes (Signed)
Pt placed on the monitor. Pt resting in bed NAD at this time. Pt endorsing their symptoms come on suddenly and pass quickly. Pt stating that the bounding in their chest feels as though their heart pounds and they become shob at times. Pt endorsing history of vaping and feeling stressed due to looking for a job at time time.  ?

## 2021-09-08 NOTE — ED Triage Notes (Signed)
Pt c/o recurrent SOB and intermittent palpitations. Pt's mom reports pt has been wearing a heart monitor, however is not wearing it now because it is charging, pt's mom reports increasing fatigue at home. Pt reports palpitations that feel like it's "beating against my chest". Pt visualized in NAD on arrival to triage, playing on cell phone.  ?

## 2021-09-09 LAB — RESP PANEL BY RT-PCR (FLU A&B, COVID) ARPGX2
Influenza A by PCR: NEGATIVE
Influenza B by PCR: NEGATIVE
SARS Coronavirus 2 by RT PCR: NEGATIVE

## 2021-09-09 LAB — TROPONIN I (HIGH SENSITIVITY): Troponin I (High Sensitivity): <2 ng/L (ref <18)

## 2021-09-09 LAB — MAGNESIUM: Magnesium: 2.3 mg/dL (ref 1.7–2.4)

## 2021-09-09 LAB — T4, FREE: Free T4: 0.94 ng/dL (ref 0.61–1.12)

## 2021-09-09 LAB — TSH: TSH: 1.398 u[IU]/mL (ref 0.350–4.500)

## 2021-09-09 NOTE — ED Provider Notes (Signed)
? ?[facility] ?Provider Note ? ? ? Event Date/Time  ? First MD Initiated Contact with Patient 09/08/21 2254   ?  (approximate) ? ? ?History  ? ?Palpitations and Shortness of Breath ? ? ?HPI ? ?Alex Hudson. is a 21 y.o. male who presents for evaluation of palpitations and dyspnea with exertion.  Patient reports that the symptoms have been ongoing since he was 21 years old.  Has been seen previously by cardiology with a Holter monitor readings showing short runs of wide ventricular tachycardia but had a negative EP test and echocardiogram.  Continues to have the symptoms almost on a daily basis.  He describes feeling lightheaded like he is going to pass out associated with palpitations and heart pounding sensation.  Sometimes also has some dyspnea with exertion.  Today was especially bad day with several episodes of palpitations, lightheadedness, and dyspnea.  Has had a mild cough for the last 2 days.  Patient has strong family history of SIDS.  No syncope, no chest pain, no fever or chills.  Currently wearing a Holter monitor that was given to him by her new cardiologist that he saw 2 weeks ago.  He is scheduled for echo, stress test, and home sleep test as well. ?  ? ? ?Past Medical History:  ?Diagnosis Date  ? Ventricular arrhythmia   ? RIGHT  ? ? ?Past Surgical History:  ?Procedure Laterality Date  ? RIGHT HEART CATH    ? TONSILLECTOMY    ? ? ? ?Physical Exam  ? ?Triage Vital Signs: ?ED Triage Vitals [09/08/21 2106]  ?Enc Vitals Group  ?   BP 127/87  ?   Pulse Rate 87  ?   Resp 18  ?   Temp 98.4 ?F (36.9 ?C)  ?   Temp Source Oral  ?   SpO2 98 %  ?   Weight 150 lb (68 kg)  ?   Height 5\' 9"  (1.753 m)  ?   Head Circumference   ?   Peak Flow   ?   Pain Score 0  ?   Pain Loc   ?   Pain Edu?   ?   Excl. in GC?   ? ? ?Most recent vital signs: ?Vitals:  ? 09/08/21 2320 09/09/21 0030  ?BP: (!) 136/96 (!) 148/102  ?Pulse: 86 (!) 105  ?Resp: 13 (!) 22  ?Temp:    ?SpO2: 100% 100%   ? ? ? ?Constitutional: Alert and oriented. Well appearing and in no apparent distress. ?HEENT: ?     Head: Normocephalic and atraumatic.    ?     Eyes: Conjunctivae are normal. Sclera is non-icteric.  ?     Mouth/Throat: Mucous membranes are moist.  ?     Neck: Supple with no signs of meningismus. ?Cardiovascular: Regular rate and rhythm. No murmurs, gallops, or rubs. 2+ symmetrical distal pulses are present in all extremities.  ?Respiratory: Normal respiratory effort. Lungs are clear to auscultation bilaterally.  ?Gastrointestinal: Soft, non tender, and non distended with positive bowel sounds. No rebound or guarding. ?Genitourinary: No CVA tenderness. ?Musculoskeletal:  No edema, cyanosis, or erythema of extremities. ?Neurologic: Normal speech and language. Face is symmetric. Moving all extremities. No gross focal neurologic deficits are appreciated. ?Skin: Skin is warm, dry and intact. No rash noted. ?Psychiatric: Mood and affect are normal. Speech and behavior are normal. ? ?ED Results / Procedures / Treatments  ? ?Labs ?(all labs ordered are listed, but only abnormal  results are displayed) ?Labs Reviewed  ?CBC - Abnormal; Notable for the following components:  ?    Result Value  ? Platelets 430 (*)   ? All other components within normal limits  ?RESP PANEL BY RT-PCR (FLU A&B, COVID) ARPGX2  ?BASIC METABOLIC PANEL  ?D-DIMER, QUANTITATIVE  ?TSH  ?T4, FREE  ?MAGNESIUM  ?TROPONIN I (HIGH SENSITIVITY)  ?TROPONIN I (HIGH SENSITIVITY)  ? ? ? ?EKG ? ?ED ECG REPORT ?I, Nita Sickle, the attending physician, personally viewed and interpreted this ECG. ? ?Normal sinus rhythm with a rate of 84, benign early repolarization, normal intervals, no ST elevations or depression ? ?RADIOLOGY ?I, Nita Sickle, attending MD, have personally viewed and interpreted the images obtained during this visit as below: ? ?Chest x-ray negative ? ? ?___________________________________________________ ?Interpretation by Radiologist:   ?DG Chest 2 View ? ?Result Date: 09/08/2021 ?CLINICAL DATA:  Palpitation. EXAM: CHEST - 2 VIEW COMPARISON:  Chest radiograph dated 06/07/2015. FINDINGS: The heart size and mediastinal contours are within normal limits. Both lungs are clear. The visualized skeletal structures are unremarkable. IMPRESSION: No active cardiopulmonary disease. Electronically Signed   By: Elgie Collard M.D.   On: 09/08/2021 21:38   ? ? ? ? ?PROCEDURES: ? ?Critical Care performed: No ? ?Procedures ? ? ? ?IMPRESSION / MDM / ASSESSMENT AND PLAN / ED COURSE  ?I reviewed the triage vital signs and the nursing notes. ? ?21 y.o. male who presents for evaluation of palpitations and dyspnea with exertion.  Patient has had similar symptoms since he was 21 years old.  Previously seen by cardiology with negative EKG, echo, and EP study.  Has reestablish care with cardiology 2 weeks ago and is currently on a Holter monitor.  I did review the notes from his visit with the cardiologist who has some concern of possible Brugada with a strong family history of SIDS.  Patient is scheduled for an echo, stress test, and home sleep study.  Today had a specially bad day with several episodes of lightheadedness, palpitations, and dyspnea with exertion.  During my exam patient seems very anxious. He made several comments about being concerned that his symptoms could be from cancer. 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  Vitals are within normal limits although initially slightly tachycardic and tachypneic but once patient relaxed his vitals normalized.  He was monitored on telemetry for 2 hours with no signs of dysrhythmias.  His EKG is unchanged when compared to baseline.  There is no electrolyte derangements, no anemia, thyroid studies are negative, COVID and flu negative, D-dimer negative, no AKI, no leukocytosis, no signs of sepsis or infection.  I  did discuss with patient the importance of continue to wear his Holter monitor and to continue to have close follow-up with cardiology so I will the tests scheduled in the near future happen to rule out a possible cardiac dysrhythmia.  Recommending no exercise or weightlifting until cleared by cardiology.  With negative work-up and patient being asymptomatic at this time I do not think he needs to be admitted to the hospital.  Discussed my standard return precautions for any changes in his clinical picture.  Differential diagnoses including dysrhythmias versus cardiomyopathy versus somatic disorder versus anxiety ? ? ?MEDICATIONS GIVEN IN ED: ?Medications - No data to display ? ? ? ?EMR reviewed including records from his recent visit to cardiology ? ? ? ?FINAL CLINICAL IMPRESSION(S) /  ED DIAGNOSES  ? ?Final diagnoses:  ?Palpitations  ? ? ? ?Rx / DC Orders  ? ?ED Discharge Orders   ? ? None  ? ?  ? ? ? ?Note:  This document was prepared using Dragon voice recognition software and may include unintentional dictation errors. ? ? ?Please note:  Patient was evaluated in Emergency Department today for the symptoms described in the history of present illness. Patient was evaluated in the context of the global COVID-19 pandemic, which necessitated consideration that the patient might be at risk for infection with the SARS-CoV-2 virus that causes COVID-19. Institutional protocols and algorithms that pertain to the evaluation of patients at risk for COVID-19 are in a state of rapid change based on information released by regulatory bodies including the CDC and federal and state organizations. These policies and algorithms were followed during the patient's care in the ED.  Some ED evaluations and interventions may be delayed as a result of limited staffing during the pandemic. ? ? ? ? ?  ?Nita SickleVeronese, [place], MD ?09/09/21 0133 ? ?

## 2021-10-31 ENCOUNTER — Encounter: Payer: Self-pay | Admitting: *Deleted

## 2021-10-31 ENCOUNTER — Emergency Department
Admission: EM | Admit: 2021-10-31 | Discharge: 2021-10-31 | Disposition: A | Discharge status: Home / Self Care | Payer: Medicaid Other | Attending: Emergency Medicine | Admitting: Emergency Medicine

## 2021-10-31 ENCOUNTER — Other Ambulatory Visit: Payer: Self-pay

## 2021-10-31 DIAGNOSIS — Z4800 Encounter for change or removal of nonsurgical wound dressing: Secondary | ICD-10-CM | POA: Diagnosis present

## 2021-10-31 DIAGNOSIS — Z5189 Encounter for other specified aftercare: Secondary | ICD-10-CM

## 2021-10-31 NOTE — ED Triage Notes (Signed)
Pt here for packing removal of abscess to upper back.  No redness or drainage.  Packing in place.  Pt alert.

## 2021-10-31 NOTE — Discharge Instructions (Signed)
Please continue with oral antibiotics.  You may remove packing in 2 days.  Return to the ER for any increasing pain swelling warmth redness or drainage.  Follow-up with general surgeon/PCP in 1 week if cyst persist

## 2021-10-31 NOTE — ED Provider Notes (Signed)
[facility] EMERGENCY DEPARTMENT Provider Note   CSN: [id number] Arrival date & time: 10/31/21  1934     History  Chief Complaint  Patient presents with   Wound Check    Alex Curl. is a 21 y.o. male.  Presents to the emergency department for evaluation of packing removal to an abscess on his back.  Patient and girlfriend describe an epidermoid cyst, a chronic fluctuant area on his back that became red after he tried to pop it.  It became red so he went to person [facility] where they performed an I&D along with packing and was placed on antibiotics 2 days ago.  Patient started Bactrim today.  He is here today for packing removal.  Is not having any pain or discomfort.  No redness or fevers.  HPI     Home Medications Prior to Admission medications   Medication Sig Start Date End Date Taking? Authorizing Provider  cetirizine (ZYRTEC) 10 MG tablet Take 10 mg by mouth at bedtime as needed for allergies. 09/10/17 11/14/18  [provider]  fluticasone (FLONASE) 50 MCG/ACT nasal spray Place 1 spray into both nostrils daily.  11/14/18  [provider]      Allergies    Augmentin [amoxicillin-pot clavulanate]    Review of Systems   Review of Systems  Physical Exam Updated Vital Signs BP (!) 122/91   Pulse 77   Temp 97.7 F (36.5 C) (Oral)   Resp 18   Ht 5\' 8"  (1.727 m)   Wt 68 kg   SpO2 98%   BMI 22.81 kg/m  Physical Exam Constitutional:      Appearance: He is well-developed.  HENT:     Head: Normocephalic and atraumatic.  Eyes:     Conjunctiva/sclera: Conjunctivae normal.  Cardiovascular:     Rate and Rhythm: Normal rate.  Pulmonary:     Effort: Pulmonary effort is normal. No respiratory distress.  Musculoskeletal:        General: Normal range of motion.     Cervical back: Normal range of motion.     Comments: Along the left thoracic spine, superior medial scapular border patient has a 1 x 1 cm area of induration  with very mild erythema with iodoform packing in place.  Iodoform packing is removed and there is a half a centimeter by 1 cm deep hole in the area of induration.  There is no purulent drainage or bleeding.  Skin:    General: Skin is warm.     Findings: No rash.  Neurological:     Mental Status: He is alert and oriented to person, place, and time.  Psychiatric:        Behavior: Behavior normal.        Thought Content: Thought content normal.     ED Results / Procedures / Treatments   Labs (all labs ordered are listed, but only abnormal results are displayed) Labs Reviewed - No data to display  EKG None  Radiology No results found.  Procedures Procedures    Medications Ordered in ED Medications - No data to display  ED Course/ Medical Decision Making/ A&P                           Medical Decision Making  21 year old male here for packing removal.  It appears he has had an epidermoid cyst that was infected that underwent I&D with packing.  There is no sign of  any significant infection today.  Based on pictures, overall size of cyst/abscess has significantly improved.  There is a open area within the wound, will repack today.  Patient will continue his Bactrim and remove packing in 2 days.  Recommend follow-up with PCP in 1 week for recheck.  Return sooner for any increasing pain swelling warmth redness or drainage Final Clinical Impression(s) / ED Diagnoses Final diagnoses:  Visit for wound check  Abscess re-check    Rx / DC Orders ED Discharge Orders     None         Ronnette Juniper 10/31/21 2007    Shaune Pollack, MD 11/03/21 [phone]

## 2021-12-25 ENCOUNTER — Emergency Department
Admission: EM | Admit: 2021-12-25 | Discharge: 2021-12-25 | Disposition: A | Discharge status: Home / Self Care | Payer: Medicaid Other | Attending: Emergency Medicine | Admitting: Emergency Medicine

## 2021-12-25 ENCOUNTER — Encounter: Payer: Self-pay | Admitting: Emergency Medicine

## 2021-12-25 ENCOUNTER — Other Ambulatory Visit: Payer: Self-pay

## 2021-12-25 DIAGNOSIS — F410 Panic disorder [episodic paroxysmal anxiety] without agoraphobia: Secondary | ICD-10-CM | POA: Diagnosis not present

## 2021-12-25 DIAGNOSIS — K219 Gastro-esophageal reflux disease without esophagitis: Secondary | ICD-10-CM | POA: Diagnosis not present

## 2021-12-25 DIAGNOSIS — R1013 Epigastric pain: Secondary | ICD-10-CM | POA: Diagnosis present

## 2021-12-25 DIAGNOSIS — R079 Chest pain, unspecified: Secondary | ICD-10-CM

## 2021-12-25 LAB — COMPREHENSIVE METABOLIC PANEL
ALT: 17 U/L (ref 0–44)
AST: 25 U/L (ref 15–41)
Albumin: 3.9 g/dL (ref 3.5–5.0)
Alkaline Phosphatase: 46 U/L (ref 38–126)
Anion gap: 7 (ref 5–15)
BUN: 15 mg/dL (ref 6–20)
CO2: 21 mmol/L — ABNORMAL LOW (ref 22–32)
Calcium: 9.1 mg/dL (ref 8.9–10.3)
Chloride: 109 mmol/L (ref 98–111)
Creatinine, Ser: 0.81 mg/dL (ref 0.61–1.24)
GFR, Estimated: >60 mL/min (ref >60)
Glucose, Bld: 101 mg/dL — ABNORMAL HIGH (ref 70–99)
Potassium: 3.5 mmol/L (ref 3.5–5.1)
Sodium: 137 mmol/L (ref 135–145)
Total Bilirubin: 0.6 mg/dL (ref 0.3–1.2)
Total Protein: 7.6 g/dL (ref 6.5–8.1)

## 2021-12-25 LAB — CBC
HCT: 44.6 % (ref 39.0–52.0)
Hemoglobin: 14.6 g/dL (ref 13.0–17.0)
MCH: 27.5 pg (ref 26.0–34.0)
MCHC: 32.7 g/dL (ref 30.0–36.0)
MCV: 84 fL (ref 80.0–100.0)
Platelets: 366 10*3/uL (ref 150–400)
RBC: 5.31 MIL/uL (ref 4.22–5.81)
RDW: 12.3 % (ref 11.5–15.5)
WBC: 7.1 10*3/uL (ref 4.0–10.5)
nRBC: 0 % (ref 0.0–0.2)

## 2021-12-25 LAB — URINALYSIS, ROUTINE W REFLEX MICROSCOPIC
Bacteria, UA: NONE SEEN
Bilirubin Urine: NEGATIVE
Glucose, UA: 50 mg/dL — AB
Ketones, ur: NEGATIVE mg/dL
Leukocytes,Ua: NEGATIVE
Nitrite: NEGATIVE
Protein, ur: NEGATIVE mg/dL
Specific Gravity, Urine: 1.004 — ABNORMAL LOW (ref 1.005–1.030)
Squamous Epithelial / HPF: NONE SEEN (ref 0–5)
pH: 6 (ref 5.0–8.0)

## 2021-12-25 LAB — LIPASE, BLOOD: Lipase: 37 U/L (ref 11–51)

## 2021-12-25 MED ORDER — OMEPRAZOLE 10 MG PO CPDR: 10.0000 mg | DELAYED_RELEASE_CAPSULE | Freq: Every day | ORAL | 2 refills | Status: AC

## 2021-12-25 NOTE — ED Triage Notes (Signed)
First Nurse Note:  Arrives via [facility] from work.  Per report, patient got hot, anxious, heart palpitations.  Has been seen for same in the past, but no medication for anxiety.  Also, patient c/o upper abdominal pain intermittently x 3 days.  Denies current abdominal pain.   Patient states he is feeling his panic much improved.    VS wnl.

## 2021-12-25 NOTE — ED Provider Triage Note (Signed)
  Emergency Medicine Provider Triage Evaluation Note  Alex Hudson. , a 21 y.o.male,  was evaluated in triage.  Pt complains of sudden onset of feeling hot/dizziness/palpitations that started earlier today lasted for approximately 10 minutes.  Additionally states that he had left-sided chest pain as well.  He states that he currently feels well after being here in the waiting room.  In addition, he states that he has been having upper abdominal pain as well, particular when he eats certain foods.   Review of Systems  Positive: Upper abdominal pain, chest pain Negative: Denies fever, headache, vomiting  Physical Exam   Vitals:   12/25/21 1722  BP: (!) 124/101  Pulse: 86  Resp: 20  Temp: 98.5 F (36.9 C)  SpO2: 96%   Gen:   Awake, no distress   Resp:  Normal effort  MSK:   Moves extremities without difficulty  Other:    Medical Decision Making  Given the patient's initial medical screening exam, the following diagnostic evaluation has been ordered. The patient will be placed in the appropriate treatment space, once one is available, to complete the evaluation and treatment. I have discussed the plan of care with the patient and I have advised the patient that an ED physician or mid-level practitioner will reevaluate their condition after the test results have been received, as the results may give them additional insight into the type of treatment they may need.    Diagnostics: Labs, EKG  Treatments: none immediately   Varney Daily, [place] 12/25/21 1757

## 2021-12-25 NOTE — ED Triage Notes (Signed)
Pt via [facility] from work. States there was a sudden onset of feeling hot, dizziness, palpitations and numbness in his bilateral legs, states that lasted for about 10 mins. States then he had a sharp pain in his L side that started a couple of minutes. Denies NVD. Pt states that when he eats certain food he has a pain across his upper abd. Denies any abd surgeries. Denies any pain at this time. Pt is A&OX4 and NAD

## 2021-12-25 NOTE — ED Provider Notes (Signed)
[facility] Provider Note  Patient Contact: 6:56 PM (approximate)   History   Panic Attack   HPI  Alex Hudson. is a 21 y.o. male who presents the emergency department for evaluation of epigastric/lower chest pain today.  Patient states that he was at work, started feeling flushed and was sweating.  He states that he had some sharp pain in the epigastric/left lower chest.  Patient does have a cardiac history, has been followed by [facility] for same.  Since the age of 21 patient has been having issues with palpitations and after multiple Holter monitors, it has been determined that patient will have some intermittent short runs of V. tach.  Typically patient has 4-6 PVCs consisting of a run of V. tach.  He has had negative EP studies, echoes, stress test.  Patient has been seen a couple months ago by [facility] cardiology, is awaiting scheduling of an echo and stress test.  Patient states that this is not his normal palpitation feel.  He also endorses that he has been having some issues with spicy and acidic type foods.  He states that when he eats same he will have a sharp pain similar to what he experiences afternoon.  He states that typically does not feel hot before the pain so this was a break from his normal routine.  He states that he did have lemonade and an orange earlier prior to the onset of his symptoms.  Patient has no symptoms now.  He has no palpitations, chest pain, shortness of breath.  No recent illnesses.     Physical Exam   Triage Vital Signs: ED Triage Vitals  Enc Vitals Group     BP 12/25/21 1722 (!) 124/101     Pulse Rate 12/25/21 1722 86     Resp 12/25/21 1722 20     Temp 12/25/21 1722 98.5 F (36.9 C)     Temp Source 12/25/21 1722 Oral     SpO2 12/25/21 1722 96 %     Weight 12/25/21 1724 153 lb (69.4 kg)     Height 12/25/21 1724 5\' 8"  (1.727 m)     Head Circumference --      Peak Flow --      Pain Score 12/25/21 1723 0     Pain Loc --       Pain Edu? --      Excl. in GC? --     Most recent vital signs: Vitals:   12/25/21 1722  BP: (!) 124/101  Pulse: 86  Resp: 20  Temp: 98.5 F (36.9 C)  SpO2: 96%     General: Alert and in no acute distress.   Cardiovascular:  Good peripheral perfusion.  Normal S1 and S2 with no appreciable murmurs, rubs, gallops. Respiratory: Normal respiratory effort without tachypnea or retractions. Lungs CTAB. Good air entry to the bases with no decreased or absent breath sounds Gastrointestinal: Bowel sounds 4 quadrants. Soft and nontender to palpation. No guarding or rigidity. No palpable masses. No distention. No CVA tenderness. Musculoskeletal: Full range of motion to all extremities.  Neurologic:  No gross focal neurologic deficits are appreciated.  Skin:   No rash noted Other:   ED Results / Procedures / Treatments   Labs (all labs ordered are listed, but only abnormal results are displayed) Labs Reviewed  URINALYSIS, ROUTINE W REFLEX MICROSCOPIC - Abnormal; Notable for the following components:      Result Value   Color, Urine COLORLESS (*)  APPearance CLEAR (*)    Specific Gravity, Urine 1.004 (*)    Glucose, UA 50 (*)    Hgb urine dipstick SMALL (*)    All other components within normal limits  COMPREHENSIVE METABOLIC PANEL - Abnormal; Notable for the following components:   CO2 21 (*)    Glucose, Bld 101 (*)    All other components within normal limits  CBC  LIPASE, BLOOD     EKG  ED ECG REPORT I, Delorise Royals Tionna Gigante,  personally viewed and interpreted this ECG.   Date: 12/25/2021  EKG Time: 1729 hrs.  Rate: 80 bpm  Rhythm: unchanged from previous tracings, normal sinus rhythm, no significant change from 09/08/2021  Axis: Normal axis  Intervals:none  ST&T Change: No ST elevation or depression noted   Normal sinus rhythm.  No change from previous EKG.  No STEMI    RADIOLOGY  I personally viewed, evaluated, and interpreted these images as part of my  medical decision making, as well as reviewing the written report by the radiologist.  ED Provider Interpretation: No acute cardiopulmonary findings.  No results found.  PROCEDURES:  Critical Care performed: No  Procedures   MEDICATIONS ORDERED IN ED: Medications - No data to display   IMPRESSION / MDM / ASSESSMENT AND PLAN / ED COURSE  I reviewed the triage vital signs and the nursing notes.                              Differential diagnosis includes, but is not limited to, STEMI, NSTEMI, acid reflux, hiatal hernia, pneumothorax, PE, arrhythmia  The patient is on the cardiac monitor to evaluate for evidence of arrhythmia and/or significant heart rate changes.  Patient's presentation is most consistent with acute presentation with potential threat to life or bodily function.   Patient's diagnosis is consistent with nonspecific chest pain, GERD.  Patient presents to the ED complaining of feeling hot, sweaty, sharp lower chest/epigastric pain.  It appears the patient does have some acid reflux as he has problems with spicy, greasy and acidic type foods.  He states that he will typically receive pain in this location when he eats 1 of these types of food.  Patient states that today he felt hot and sweaty before the onset of pain which is different from his normal.  He does have a significant cardiac history with runs of V. tach.  Patient states that he had no palpitations consistent with V. tach.  He has no ongoing pain at this time.  Labs, EKG, chest x-ray are reassuring.  At this time I suspect that as he had an orange and lemonade prior to the onset of the sharp epigastric pain that this is likely GI.  He already has a cardiologist and has currently being scheduled for an echo and stress test.  At this time I do not feel that patient requires further work-up.  Patient is given return precautions.  I will place the patient on omeprazole as he takes nothing for his acid reflux.  Follow-up  with his cardiologist or primary care as needed.  Patient is given ED precautions to return to the ED for any worsening or new symptoms.        FINAL CLINICAL IMPRESSION(S) / ED DIAGNOSES   Final diagnoses:  Nonspecific chest pain  Gastroesophageal reflux disease, unspecified whether esophagitis present     Rx / DC Orders   ED Discharge Orders  Ordered    omeprazole (PRILOSEC) 10 MG capsule  Daily        12/25/21 1944             Note:  This document was prepared using Dragon voice recognition software and may include unintentional dictation errors.   Lanette Hampshire 12/25/21 1944    Chesley Noon, MD 12/25/21 2223

## 2022-02-27 LAB — HSV DNA BY PCR (REFERENCE LAB)
HSV 1 DNA: NEGATIVE
HSV 2 DNA: NEGATIVE

## 2022-06-04 ENCOUNTER — Encounter: Payer: Self-pay | Admitting: Emergency Medicine

## 2022-06-04 ENCOUNTER — Emergency Department
Admission: EM | Admit: 2022-06-04 | Discharge: 2022-06-04 | Disposition: A | Discharge status: Home / Self Care | Payer: Medicaid Other | Attending: Emergency Medicine | Admitting: Emergency Medicine

## 2022-06-04 DIAGNOSIS — Z1152 Encounter for screening for COVID-19: Secondary | ICD-10-CM | POA: Diagnosis not present

## 2022-06-04 DIAGNOSIS — J029 Acute pharyngitis, unspecified: Secondary | ICD-10-CM | POA: Insufficient documentation

## 2022-06-04 DIAGNOSIS — R0981 Nasal congestion: Secondary | ICD-10-CM | POA: Diagnosis present

## 2022-06-04 LAB — RESP PANEL BY RT-PCR (RSV, FLU A&B, COVID)  RVPGX2
Influenza A by PCR: NEGATIVE
Influenza B by PCR: NEGATIVE
Resp Syncytial Virus by PCR: NEGATIVE
SARS Coronavirus 2 by RT PCR: NEGATIVE

## 2022-06-04 LAB — GROUP A STREP BY PCR: Group A Strep by PCR: NOT DETECTED

## 2022-06-04 MED ORDER — ACETAMINOPHEN 325 MG PO TABS
650.0000 mg | ORAL_TABLET | Freq: Once | ORAL | Status: AC
  Administered 2022-06-04: 650 mg via ORAL
  Filled 2022-06-04: qty 2

## 2022-06-04 MED ORDER — IBUPROFEN 600 MG PO TABS
600.0000 mg | ORAL_TABLET | Freq: Once | ORAL | Status: AC
  Administered 2022-06-04: 600 mg via ORAL
  Filled 2022-06-04: qty 1

## 2022-06-04 NOTE — ED Provider Notes (Signed)
[facility] Provider Note    Event Date/Time   First MD Initiated Contact with Patient 06/04/22 2045     (approximate)   History   Sore Throat   HPI  Alex Hudson. is a 22 y.o. male with no reported past medical history presents today for evaluation of sore throat, nasal congestion, and sneezing for the past 2 days.  He reports that he is also noticed white patches in the back of his mouth.  He reports that he feels like he is trying to get up a lot of phlegm.  He denies chest pain or shortness of breath.  No fevers or chills.  He reports that he has a sore throat that is mostly left-sided.  No voice change.  No trouble opening his mouth.  Patient Active Problem List   Diagnosis Date Noted   Viral meningitis 07/11/2019   Acute nonintractable headache    Diarrhea    Viral meningitis, unspecified 07/10/2019   Hypokalemia 07/10/2019   Migraine headache 07/10/2019          Physical Exam   Triage Vital Signs: ED Triage Vitals [06/04/22 2033]  Enc Vitals Group     BP 120/88     Pulse Rate 76     Resp 16     Temp 98.4 F (36.9 C)     Temp Source Oral     SpO2 99 %     Weight 162 lb (73.5 kg)     Height 5\' 9"  (1.753 m)     Head Circumference      Peak Flow      Pain Score 7     Pain Loc      Pain Edu?      Excl. in [place]?     Most recent vital signs: Vitals:   06/04/22 2033 06/04/22 2213  BP: 120/88 118/70  Pulse: 76 70  Resp: 16 18  Temp: 98.4 F (36.9 C) 98 F (36.7 C)  SpO2: 99% 98%    Physical Exam Vitals and nursing note reviewed.  Constitutional:      General: Awake and alert. No acute distress.    Appearance: Normal appearance. The patient is normal weight.  HENT:     Head: Normocephalic and atraumatic.     Mouth: Mucous membranes are moist. Uvula midline.  Left-sided tonsillar exudate.  No soft palate fluctuance.  No trismus.  No voice change.  No sublingual swelling.  No tender cervical lymphadenopathy.  No nuchal  rigidity Nasal congestion present Eyes:     General: PERRL. Normal EOMs        Right eye: No discharge.        Left eye: No discharge.     Conjunctiva/sclera: Conjunctivae normal.  Cardiovascular:     Rate and Rhythm: Normal rate and regular rhythm.     Pulses: Normal pulses.  Pulmonary:     Effort: Pulmonary effort is normal. No respiratory distress.     Breath sounds: Normal breath sounds.  Abdominal:     Abdomen is soft. There is no abdominal tenderness. No rebound or guarding. No distention. Musculoskeletal:        General: No swelling. Normal range of motion.     Cervical back: Normal range of motion and neck supple.  Skin:    General: Skin is warm and dry.     Capillary Refill: Capillary refill takes less than 2 seconds.     Findings: No rash.  Neurological:  Mental Status: The patient is awake and alert.      ED Results / Procedures / Treatments   Labs (all labs ordered are listed, but only abnormal results are displayed) Labs Reviewed  RESP PANEL BY RT-PCR (RSV, FLU A&B, COVID)  [id number]  GROUP A STREP BY PCR     EKG     RADIOLOGY     PROCEDURES:  Critical Care performed:   Procedures   MEDICATIONS ORDERED IN ED: Medications  acetaminophen (TYLENOL) tablet 650 mg (650 mg Oral Given 06/04/22 2209)  ibuprofen (ADVIL) tablet 600 mg (600 mg Oral Given 06/04/22 2209)     IMPRESSION / MDM / ASSESSMENT AND PLAN / ED COURSE  I reviewed the triage vital signs and the nursing notes.   Differential diagnosis includes, but is not limited to, strep pharyngitis, COVID, influenza, other viral pharyngitis or other viral URI.  Patient is awake and alert, hemodynamically stable and afebrile.  He is nontoxic in appearance.  His uvula is midline, he has no trismus, no voice change, no drooling, do not suspect peritonsillar or retropharyngeal abscess.  No sublingual swelling to suggest Ludwig's angina.  Strep, COVID/flu/RSV swabs are negative.  We discussed  symptomatic management and return precautions.  Patient understands and agrees with plan.  He was discharged in stable condition with his mother.   Patient's presentation is most consistent with acute complicated illness / injury requiring diagnostic workup.      FINAL CLINICAL IMPRESSION(S) / ED DIAGNOSES   Final diagnoses:  Pharyngitis, unspecified etiology     Rx / DC Orders   ED Discharge Orders     None        Note:  This document was prepared using Dragon voice recognition software and may include unintentional dictation errors.   Emeline Gins 06/04/22 2217    Blake Divine, MD 06/04/22 2250

## 2022-06-04 NOTE — ED Triage Notes (Addendum)
Pt c/o left sided ear pain with sore throat on left side as well x2 days. Pt used at home wax removal kit on 1/29 without relief. Pt denies cough and fever. White patches noted to the back of throat on left side with redness and inflammation. Noted bilateral tonsillectomy.

## 2022-06-04 NOTE — Discharge Instructions (Addendum)
Your COVID/strep/Flu/RSV swabs were negative. You likely have another viral etiology. Return for any new, worsening, or change in symptoms or other concerns. It was a pleasure caring for you today.

## 2022-07-28 ENCOUNTER — Emergency Department
Admission: EM | Admit: 2022-07-28 | Discharge: 2022-07-28 | Disposition: A | Discharge status: Home / Self Care | Payer: Medicaid Other | Attending: Emergency Medicine | Admitting: Emergency Medicine

## 2022-07-28 ENCOUNTER — Other Ambulatory Visit: Payer: Self-pay

## 2022-07-28 DIAGNOSIS — R002 Palpitations: Secondary | ICD-10-CM | POA: Diagnosis not present

## 2022-07-28 DIAGNOSIS — R079 Chest pain, unspecified: Secondary | ICD-10-CM | POA: Diagnosis present

## 2022-07-28 DIAGNOSIS — R55 Syncope and collapse: Secondary | ICD-10-CM | POA: Insufficient documentation

## 2022-07-28 LAB — BASIC METABOLIC PANEL
Anion gap: 5 (ref 5–15)
BUN: 20 mg/dL (ref 6–20)
CO2: 24 mmol/L (ref 22–32)
Calcium: 8.4 mg/dL — ABNORMAL LOW (ref 8.9–10.3)
Chloride: 105 mmol/L (ref 98–111)
Creatinine, Ser: 0.88 mg/dL (ref 0.61–1.24)
GFR, Estimated: >60 mL/min (ref >60)
Glucose, Bld: 102 mg/dL — ABNORMAL HIGH (ref 70–99)
Potassium: 3.5 mmol/L (ref 3.5–5.1)
Sodium: 134 mmol/L — ABNORMAL LOW (ref 135–145)

## 2022-07-28 LAB — CBC
HCT: 43.4 % (ref 39.0–52.0)
Hemoglobin: 14.1 g/dL (ref 13.0–17.0)
MCH: 27.7 pg (ref 26.0–34.0)
MCHC: 32.5 g/dL (ref 30.0–36.0)
MCV: 85.3 fL (ref 80.0–100.0)
Platelets: 348 10*3/uL (ref 150–400)
RBC: 5.09 MIL/uL (ref 4.22–5.81)
RDW: 12.5 % (ref 11.5–15.5)
WBC: 6.8 10*3/uL (ref 4.0–10.5)
nRBC: 0 % (ref 0.0–0.2)

## 2022-07-28 LAB — TROPONIN I (HIGH SENSITIVITY): Troponin I (High Sensitivity): <2 ng/L (ref <18)

## 2022-07-28 NOTE — ED Provider Notes (Signed)
[facility] Provider Note   Event Date/Time   First MD Initiated Contact with Patient 07/28/22 1832     (approximate) History  Seizures and Chest Pain  HPI Alex Hudson. is a 22 y.o. male with a stated past medical history of arrhythmia and loss of consciousness for an unknown period of time.  Per patient and significant other at bedside, states she was told that patient did have a short episode of shaking activity after this loss of consciousness that she states is not uncommon for him.  Patient states that he had an episode of palpitations, chest tightness, and shortness of breath that lasted approximately 5 minutes and resolved spontaneously the patient describes as his normal panic attacks.  Patient states that approximately 10 minutes after this initial event he began having chest pain and palpitations once again prior to losing consciousness.  Patient states that he was somewhat confused upon awakening but returned to baseline in less than 5 minutes and EMS was on scene for transport.  Patient's significant other states that patient's mother took EMS tracings of a possible arrhythmia so they do not have them with him. ROS: Patient currently denies any vision changes, tinnitus, difficulty speaking, facial droop, sore throat, shortness of breath, abdominal pain, nausea/vomiting/diarrhea, dysuria, or weakness/numbness/paresthesias in any extremity   Physical Exam  Triage Vital Signs: ED Triage Vitals  Enc Vitals Group     BP 07/28/22 1733 (!) 127/99     Pulse Rate 07/28/22 1733 85     Resp 07/28/22 1733 16     Temp 07/28/22 1733 98 F (36.7 C)     Temp Source 07/28/22 1733 Oral     SpO2 07/28/22 1733 98 %     Weight 07/28/22 1734 165 lb (74.8 kg)     Height 07/28/22 1734 5\' 9"  (1.753 m)     Head Circumference --      Peak Flow --      Pain Score 07/28/22 1734 0     Pain Loc --      Pain Edu? --      Excl. in [place]? --    Most recent vital  signs: Vitals:   07/28/22 1900 07/28/22 1926  BP: (!) 131/94   Pulse: 78   Resp: 15   Temp:  98.3 F (36.8 C)  SpO2: 100%    General: Awake, oriented x4. CV:  Good peripheral perfusion.  Resp:  Normal effort.  Abd:  No distention.  Other:  Young adult well-developed well-nourished Caucasian male laying in stretcher in no acute distress ED Results / Procedures / Treatments  Labs (all labs ordered are listed, but only abnormal results are displayed) Labs Reviewed  BASIC METABOLIC PANEL - Abnormal; Notable for the following components:      Result Value   Sodium 134 (*)    Glucose, Bld 102 (*)    Calcium 8.4 (*)    All other components within normal limits  CBC  TROPONIN I (HIGH SENSITIVITY)  TROPONIN I (HIGH SENSITIVITY)   EKG ED ECG REPORT I, Naaman Plummer, the attending physician, personally viewed and interpreted this ECG. Date: 07/28/2022 EKG Time: 1729 Rate: 79 Rhythm: normal sinus rhythm QRS Axis: normal Intervals: normal ST/T Wave abnormalities: normal Narrative Interpretation: no evidence of acute ischemia PROCEDURES: Critical Care performed: No .1-3 Lead EKG Interpretation  Performed by: Naaman Plummer, MD Authorized by: Naaman Plummer, MD     Interpretation: normal  ECG rate:  71   ECG rate assessment: normal     Rhythm: sinus rhythm     Ectopy: none     Conduction: normal    MEDICATIONS ORDERED IN ED: Medications - No data to display IMPRESSION / MDM / [place] / ED COURSE  I reviewed the triage vital signs and the nursing notes.                             The patient is on the cardiac monitor to evaluate for evidence of arrhythmia and/or significant heart rate changes. Patient's presentation is most consistent with acute presentation with potential threat to life or bodily function. Patient presents with complaints of syncope/presyncope ED Workup:  CBC, BMP, Troponin, BNP, ECG, CXR Differential diagnosis includes HF, ICH,  seizure, stroke, HOCM, ACS, aortic dissection, malignant arrhythmia, or GI bleed. Findings: No evidence of acute laboratory abnormalities.  Troponin negative x1 EKG: No e/o STEMI. No evidence of Brugadas sign, delta wave, epsilon wave, significantly prolonged QTc, or malignant arrhythmia.  Disposition: Discharge. Patient is at baseline at this time. Return precautions expressed and understood in person. Advised follow up with primary care provider or clinic physician in next 24 hours.   FINAL CLINICAL IMPRESSION(S) / ED DIAGNOSES   Final diagnoses:  Chest pain, unspecified type  Syncope and collapse  Palpitations   Rx / DC Orders   ED Discharge Orders          Ordered    Ambulatory referral to Cardiology       Comments: If you have not heard from the Cardiology office within the next 72 hours please call [phone].   07/28/22 1920           Note:  This document was prepared using Dragon voice recognition software and may include unintentional dictation errors.   Naaman Plummer, MD 07/28/22 [phone]

## 2022-07-28 NOTE — ED Triage Notes (Signed)
Pt to ED from work for possible seizure like activity and CP. Pt advised he doesn't remember what happened. Staff called 911 due to pt being unresponsive on scene. Pt refused transport via EMS. Pt is CAOx4 and in no acute distress, ambulatory in triage. Pt has had HX of seizures and heart issues in the past.

## 2022-09-23 ENCOUNTER — Emergency Department: Payer: Medicaid Other

## 2022-09-23 ENCOUNTER — Emergency Department
Admission: EM | Admit: 2022-09-23 | Discharge: 2022-09-23 | Disposition: A | Discharge status: Home / Self Care | Payer: Medicaid Other | Attending: Emergency Medicine | Admitting: Emergency Medicine

## 2022-09-23 DIAGNOSIS — Y906 Blood alcohol level of 120-199 mg/100 ml: Secondary | ICD-10-CM | POA: Insufficient documentation

## 2022-09-23 DIAGNOSIS — R569 Unspecified convulsions: Secondary | ICD-10-CM | POA: Diagnosis present

## 2022-09-23 DIAGNOSIS — E876 Hypokalemia: Secondary | ICD-10-CM | POA: Insufficient documentation

## 2022-09-23 DIAGNOSIS — F10920 Alcohol use, unspecified with intoxication, uncomplicated: Secondary | ICD-10-CM

## 2022-09-23 DIAGNOSIS — F10120 Alcohol abuse with intoxication, uncomplicated: Secondary | ICD-10-CM | POA: Insufficient documentation

## 2022-09-23 LAB — CBC
HCT: 43.9 % (ref 39.0–52.0)
Hemoglobin: 14.7 g/dL (ref 13.0–17.0)
MCH: 28.3 pg (ref 26.0–34.0)
MCHC: 33.5 g/dL (ref 30.0–36.0)
MCV: 84.6 fL (ref 80.0–100.0)
Platelets: 338 10*3/uL (ref 150–400)
RBC: 5.19 MIL/uL (ref 4.22–5.81)
RDW: 12.4 % (ref 11.5–15.5)
WBC: 8.3 10*3/uL (ref 4.0–10.5)
nRBC: 0 % (ref 0.0–0.2)

## 2022-09-23 LAB — ETHANOL: Alcohol, Ethyl (B): 125 mg/dL — ABNORMAL HIGH (ref <10)

## 2022-09-23 LAB — COMPREHENSIVE METABOLIC PANEL
ALT: 15 U/L (ref 0–44)
AST: 24 U/L (ref 15–41)
Albumin: 3.8 g/dL (ref 3.5–5.0)
Alkaline Phosphatase: 46 U/L (ref 38–126)
Anion gap: 12 (ref 5–15)
BUN: 8 mg/dL (ref 6–20)
CO2: 20 mmol/L — ABNORMAL LOW (ref 22–32)
Calcium: 8.3 mg/dL — ABNORMAL LOW (ref 8.9–10.3)
Chloride: 110 mmol/L (ref 98–111)
Creatinine, Ser: 0.73 mg/dL (ref 0.61–1.24)
GFR, Estimated: >60 mL/min (ref >60)
Glucose, Bld: 90 mg/dL (ref 70–99)
Potassium: 3 mmol/L — ABNORMAL LOW (ref 3.5–5.1)
Sodium: 142 mmol/L (ref 135–145)
Total Bilirubin: 0.5 mg/dL (ref 0.3–1.2)
Total Protein: 6.9 g/dL (ref 6.5–8.1)

## 2022-09-23 LAB — URINE DRUG SCREEN, QUALITATIVE (ARMC ONLY)
Amphetamines, Ur Screen: NOT DETECTED
Barbiturates, Ur Screen: NOT DETECTED
Benzodiazepine, Ur Scrn: POSITIVE — AB
Cannabinoid 50 Ng, Ur ~~LOC~~: NOT DETECTED
Cocaine Metabolite,Ur ~~LOC~~: NOT DETECTED
MDMA (Ecstasy)Ur Screen: NOT DETECTED
Methadone Scn, Ur: NOT DETECTED
Opiate, Ur Screen: NOT DETECTED
Phencyclidine (PCP) Ur S: NOT DETECTED
Tricyclic, Ur Screen: NOT DETECTED

## 2022-09-23 MED ORDER — SODIUM CHLORIDE 0.9 % IV BOLUS
1000.0000 mL | Freq: Once | INTRAVENOUS | Status: AC
  Administered 2022-09-23: 1000 mL via INTRAVENOUS

## 2022-09-23 MED ORDER — POTASSIUM CHLORIDE CRYS ER 20 MEQ PO TBCR
40.0000 meq | EXTENDED_RELEASE_TABLET | Freq: Once | ORAL | Status: AC
  Administered 2022-09-23: 40 meq via ORAL
  Filled 2022-09-23: qty 2

## 2022-09-23 NOTE — ED Triage Notes (Signed)
Arrives via [facility] for reported seizure like activity this morning.  Patient appeared post-ictal upon EMS arrival.  Patient began seizing again on scene and EMS administered 5mg  IV Versed.  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  Patient arouses to vigorous stimulation upon arrival.

## 2022-09-23 NOTE — ED Provider Notes (Signed)
[facility] Provider Note    Event Date/Time   First MD Initiated Contact with Patient 09/23/22 [phone]     (approximate)   History   Seizures   HPI  Level V caveat: Limited by decreased LOC  Alex Hudson. is a 22 y.o. male brought to the ED via EMS from parking lot or friend's house with a chief complaint of seizure.  Friends state patient with seizure, bystander CPR performed.  No history of trauma.  History is limited; friend states patient has seizure disorder but unsure what medicine he takes.  Patient endorses CBD Gummies to help with his anxiety.  Received 5 mg IV Versed and route for seizure-like activity noted by EMS.  Arrives to the ED drowsy but able to follow simple commands.  Denies chest pain, shortness of breath, nausea or vomiting.     Past Medical History   Past Medical History:  Diagnosis Date   Ventricular arrhythmia    RIGHT     Active Problem List   Patient Active Problem List   Diagnosis Date Noted   Viral meningitis 07/11/2019   Acute nonintractable headache    Diarrhea    Viral meningitis, unspecified 07/10/2019   Hypokalemia 07/10/2019   Migraine headache 07/10/2019     Past Surgical History   Past Surgical History:  Procedure Laterality Date   RIGHT HEART CATH     TONSILLECTOMY       Home Medications   Prior to Admission medications   Medication Sig Start Date End Date Taking? Authorizing Provider  omeprazole (PRILOSEC) 10 MG capsule Take 1 capsule (10 mg total) by mouth daily. 12/25/21   Cuthriell, Delorise Royals, PA-C  cetirizine (ZYRTEC) 10 MG tablet Take 10 mg by mouth at bedtime as needed for allergies. 09/10/17 11/14/18  [provider]  fluticasone (FLONASE) 50 MCG/ACT nasal spray Place 1 spray into both nostrils daily.  11/14/18  [provider]     Allergies  Augmentin [amoxicillin-pot clavulanate]   Family History   Family History  Problem Relation Age of Onset    Hypertension Mother    Hypertension Father      Physical Exam  Triage Vital Signs: ED Triage Vitals  Enc Vitals Group     BP      Pulse      Resp      Temp      Temp src      SpO2      Weight      Height      Head Circumference      Peak Flow      Pain Score      Pain Loc      Pain Edu?      Excl. in [place]?     Updated Vital Signs: BP 103/64   Pulse 94   Temp 98.4 F (36.9 C)   Resp (!) 22   Wt 73.9 kg   SpO2 96%   BMI 24.07 kg/m    General: Drowsy but arousable to voice, no distress.  Did not bite tongue.  Did not have urinary incontinence. CV:  RRR go peripheral perfusion.  Resp:  Normal effort. CTAB Abd:  Nontender.  No distention.  Other:  PERRL.  EOMI.  Slurred speech.  MAE x 4.  Follows simple commands.   ED Results / Procedures / Treatments  Labs (all labs ordered are listed, but only abnormal results are displayed) Labs Reviewed  COMPREHENSIVE METABOLIC PANEL -  Abnormal; Notable for the following components:      Result Value   Potassium 3.0 (*)    CO2 20 (*)    Calcium 8.3 (*)    All other components within normal limits  ETHANOL - Abnormal; Notable for the following components:   Alcohol, Ethyl (B) 125 (*)    All other components within normal limits  URINE DRUG SCREEN, QUALITATIVE ([facility] ONLY) - Abnormal; Notable for the following components:   Benzodiazepine, Ur Scrn POSITIVE (*)    All other components within normal limits  CBC     EKG  ED ECG REPORT I, Yaret Hush J, the attending physician, personally viewed and interpreted this ECG.   Date: 09/23/2022  EKG Time: 0516  Rate: 91  Rhythm: normal sinus rhythm  Axis: Normal  Intervals:none  ST&T Change: Nonspecific    RADIOLOGY I have independently visualized and interpreted patient's chest x-ray and ET head as well as noted the radiology interpretation:  X-ray: No acute cardiopulmonary process  CT head: No ICH  Official radiology report(s): DG Chest Port 1 View  Result  Date: 09/23/2022 CLINICAL DATA:  22 year old male with seizure like activity. Postictal. EXAM: PORTABLE CHEST 1 VIEW COMPARISON:  Chest radiographs 09/08/2021 and earlier. FINDINGS: Portable AP semi upright views at 0500 hours. Low lung volumes. Mediastinal contours remain normal. Visualized tracheal air column is within normal limits. Allowing for portable technique the lungs are clear. No pneumothorax or pleural effusion. Negative visible bowel gas. Levoconvex thoracic spine curvature may be positional artifact, new from last year. No other osseous abnormality identified. IMPRESSION: Low lung volumes, otherwise no acute cardiopulmonary abnormality. Electronically Signed   By: Odessa Fleming M.D.   On: 09/23/2022 06:17   CT Head Wo Contrast  Result Date: 09/23/2022 CLINICAL DATA:  22 year old male with seizure like activity. Postictal. EXAM: CT HEAD WITHOUT CONTRAST TECHNIQUE: Contiguous axial images were obtained from the base of the skull through the vertex without intravenous contrast. RADIATION DOSE REDUCTION: This exam was performed according to the departmental dose-optimization program which includes automated exposure control, adjustment of the mA and/or kV according to patient size and/or use of iterative reconstruction technique. COMPARISON:  Head CT 07/10/2019.  Brain MRI 07/11/2019. FINDINGS: Brain: Cerebral volume remains normal. No midline shift, ventriculomegaly, mass effect, evidence of mass lesion, intracranial hemorrhage or evidence of cortically based acute infarction. Gray-white matter differentiation is within normal limits throughout the brain. Vascular: No suspicious intracranial vascular hyperdensity. Skull: Stable, intact. 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 Other Visualized paranasal sinuses and mastoids are stable and well aerated. Other: Orbit and scalp soft tissues are within normal limits. IMPRESSION: Stable and unremarkable noncontrast Head CT.  Electronically Signed   By: Odessa Fleming M.D.   On: 09/23/2022 06:15     PROCEDURES:  Critical Care performed: No  .1-3 Lead EKG Interpretation  Performed by: Irean Hong, MD Authorized by: Irean Hong, MD     Interpretation: normal     ECG rate:  90   ECG rate assessment: normal     Rhythm: sinus rhythm     Ectopy: none     Conduction: normal   Comments:     Patient placed on cardiac monitor to evaluate for arrhythmias    MEDICATIONS ORDERED IN ED: Medications  sodium chloride 0.9 % bolus 1,000 mL (1,000 mLs Intravenous New Bag/Given 09/23/22 0544)  potassium chloride SA (KLOR-CON M) CR tablet 40 mEq (40 mEq Oral Given 09/23/22 0703)  IMPRESSION / MDM / ASSESSMENT AND PLAN / ED COURSE  I reviewed the triage vital signs and the nursing notes.                             22 year old male brought for seizure. Differential diagnosis includes, but is not limited to, alcohol, illicit or prescription medications, or other toxic ingestion; intracranial pathology such as stroke or intracerebral hemorrhage; fever or infectious causes including sepsis; hypoxemia and/or hypercarbia; uremia; trauma; endocrine related disorders such as diabetes, hypoglycemia, and thyroid-related diseases; hypertensive encephalopathy; etc. I personally reviewed patient's records and note cardiology office visit on 08/12/2022 for palpitations.  Patient's presentation is most consistent with acute presentation with potential threat to life or bodily function.  The patient is on the cardiac monitor to evaluate for evidence of arrhythmia and/or significant heart rate changes.  Will obtain toxicological lab work and urine, CT head, chest x-ray.  Placed on seizure precautions, administer IV fluid resuscitation and reassess.  Clinical Course as of 09/23/22 1610  Mon Sep 23, 2022  0559 Awake, alert, talking with brother. [JS]  [id number] Patient fell again.  Updated brother on all test results, unremarkable CT head and  chest x-ray.  Will administer second liter IV fluids.  Patient may be discharged once he is awake and ambulatory with steady gait.  Strict return precautions given.  Brother verbalizes understanding and agrees with plan of care. [JS]    Clinical Course User Index [JS] Irean Hong, MD     FINAL CLINICAL IMPRESSION(S) / ED DIAGNOSES   Final diagnoses:  Seizure (HCC)  Alcoholic intoxication without complication (HCC)  Hypokalemia     Rx / DC Orders   ED Discharge Orders     None        Note:  This document was prepared using Dragon voice recognition software and may include unintentional dictation errors.   Irean Hong, MD 09/23/22 [phone]

## 2022-09-23 NOTE — Discharge Instructions (Signed)
Do not drive or operate heavy machinery until seen by the neurologist.  Drink alcohol only in moderation.  Return to the ER for recurrent or worsening symptoms, persistent vomiting, difficulty breathing or other concerns.

## 2022-12-20 ENCOUNTER — Emergency Department
Admission: EM | Admit: 2022-12-20 | Discharge: 2022-12-20 | Discharge status: Against Medical Advice | Payer: Medicaid Other | Source: Home / Self Care

## 2023-04-06 ENCOUNTER — Other Ambulatory Visit: Payer: Self-pay

## 2023-04-06 ENCOUNTER — Emergency Department
Admission: EM | Admit: 2023-04-06 | Discharge: 2023-04-06 | Disposition: A | Discharge status: Home / Self Care | Payer: Self-pay | Attending: Emergency Medicine | Admitting: Emergency Medicine

## 2023-04-06 ENCOUNTER — Emergency Department: Payer: Self-pay

## 2023-04-06 DIAGNOSIS — J181 Lobar pneumonia, unspecified organism: Secondary | ICD-10-CM | POA: Insufficient documentation

## 2023-04-06 DIAGNOSIS — D72829 Elevated white blood cell count, unspecified: Secondary | ICD-10-CM | POA: Insufficient documentation

## 2023-04-06 DIAGNOSIS — R000 Tachycardia, unspecified: Secondary | ICD-10-CM | POA: Insufficient documentation

## 2023-04-06 DIAGNOSIS — J189 Pneumonia, unspecified organism: Secondary | ICD-10-CM

## 2023-04-06 DIAGNOSIS — Z20822 Contact with and (suspected) exposure to covid-19: Secondary | ICD-10-CM | POA: Insufficient documentation

## 2023-04-06 LAB — CBC WITH DIFFERENTIAL/PLATELET
Abs Immature Granulocytes: 0.03 10*3/uL (ref 0.00–0.07)
Basophils Absolute: 0 10*3/uL (ref 0.0–0.1)
Basophils Relative: 0 %
Eosinophils Absolute: 0.8 10*3/uL — ABNORMAL HIGH (ref 0.0–0.5)
Eosinophils Relative: 7 %
HCT: 41.9 % (ref 39.0–52.0)
Hemoglobin: 13.8 g/dL (ref 13.0–17.0)
Immature Granulocytes: 0 %
Lymphocytes Relative: 17 %
Lymphs Abs: 2 10*3/uL (ref 0.7–4.0)
MCH: 27.6 pg (ref 26.0–34.0)
MCHC: 32.9 g/dL (ref 30.0–36.0)
MCV: 83.8 fL (ref 80.0–100.0)
Monocytes Absolute: 1 10*3/uL (ref 0.1–1.0)
Monocytes Relative: 9 %
Neutro Abs: 7.8 10*3/uL — ABNORMAL HIGH (ref 1.7–7.7)
Neutrophils Relative %: 67 %
Platelets: 362 10*3/uL (ref 150–400)
RBC: 5 MIL/uL (ref 4.22–5.81)
RDW: 12.7 % (ref 11.5–15.5)
WBC: 11.8 10*3/uL — ABNORMAL HIGH (ref 4.0–10.5)
nRBC: 0 % (ref 0.0–0.2)

## 2023-04-06 LAB — RESP PANEL BY RT-PCR (RSV, FLU A&B, COVID)  RVPGX2
Influenza A by PCR: NEGATIVE
Influenza B by PCR: NEGATIVE
Resp Syncytial Virus by PCR: NEGATIVE
SARS Coronavirus 2 by RT PCR: NEGATIVE

## 2023-04-06 LAB — BASIC METABOLIC PANEL
Anion gap: 6 (ref 5–15)
BUN: 9 mg/dL (ref 6–20)
CO2: 24 mmol/L (ref 22–32)
Calcium: 8.6 mg/dL — ABNORMAL LOW (ref 8.9–10.3)
Chloride: 106 mmol/L (ref 98–111)
Creatinine, Ser: 0.87 mg/dL (ref 0.61–1.24)
GFR, Estimated: >60 mL/min (ref >60)
Glucose, Bld: 102 mg/dL — ABNORMAL HIGH (ref 70–99)
Potassium: 3.9 mmol/L (ref 3.5–5.1)
Sodium: 136 mmol/L (ref 135–145)

## 2023-04-06 LAB — GROUP A STREP BY PCR: Group A Strep by PCR: NOT DETECTED

## 2023-04-06 MED ORDER — ACETAMINOPHEN 325 MG PO TABS
650.0000 mg | ORAL_TABLET | Freq: Once | ORAL | Status: AC
  Administered 2023-04-06: 650 mg via ORAL
  Filled 2023-04-06: qty 2

## 2023-04-06 MED ORDER — AZITHROMYCIN 250 MG PO TABS: ORAL_TABLET | ORAL | 0 refills | Status: AC

## 2023-04-06 MED ORDER — CEFDINIR 300 MG PO CAPS: 300.0000 mg | ORAL_CAPSULE | Freq: Two times a day (BID) | ORAL | 0 refills | Status: AC

## 2023-04-06 NOTE — ED Triage Notes (Signed)
Pt comes with c/o cough for about week. Pt states he has been having chills. Pt states generalized body aches. Pt states fevers also.

## 2023-04-06 NOTE — ED Provider Notes (Signed)
[facility] Provider Note    Event Date/Time   First MD Initiated Contact with Patient 04/06/23 [phone]     (approximate)   History   Cough   HPI  Alex Hudson. is a 22 y.o. male who presents today for evaluation of cough and bodyaches for the past several days.  He reports that his whole family is sick with the same symptoms.  His 108-year-old had "walking pneumonia."  No fevers.  He reports that he vomited 1 time today after coughing a lot.  He is not feeling nauseated otherwise.  No diarrhea.  He is requesting blood work to be tested for diabetes because it "runs in my family."  Patient Active Problem List   Diagnosis Date Noted   Viral meningitis 07/11/2019   Acute nonintractable headache    Diarrhea    Viral meningitis, unspecified 07/10/2019   Hypokalemia 07/10/2019   Migraine headache 07/10/2019          Physical Exam   Triage Vital Signs: ED Triage Vitals  Encounter Vitals Group     BP 04/06/23 0936 125/82     Systolic BP Percentile --      Diastolic BP Percentile --      Pulse Rate 04/06/23 0936 (!) 114     Resp 04/06/23 0936 19     Temp 04/06/23 0936 99.7 F (37.6 C)     Temp Source 04/06/23 0936 Oral     SpO2 04/06/23 0936 97 %     Weight 04/06/23 0935 170 lb (77.1 kg)     Height 04/06/23 0935 5\' 8"  (1.727 m)     Head Circumference --      Peak Flow --      Pain Score 04/06/23 0934 7     Pain Loc --      Pain Education --      Exclude from Growth Chart --     Most recent vital signs: Vitals:   04/06/23 0936 04/06/23 1111  BP: 125/82 110/71  Pulse: (!) 114   Resp: 19 18  Temp: 99.7 F (37.6 C) 99.3 F (37.4 C)  SpO2: 97% 97%    Physical Exam Vitals and nursing note reviewed.  Constitutional:      General: Awake and alert. No acute distress.    Appearance: Normal appearance. The patient is normal weight.  HENT:     Head: Normocephalic and atraumatic.     Mouth: Mucous membranes are moist. Uvula midline.  No  tonsillar exudate.  No soft palate fluctuance.  No trismus.  No voice change.  No sublingual swelling.  No tender cervical lymphadenopathy.  No nuchal rigidity Nasal congestion present Eyes:     General: PERRL. Normal EOMs        Right eye: No discharge.        Left eye: No discharge.     Conjunctiva/sclera: Conjunctivae normal.  Cardiovascular:     Rate and Rhythm: Normal rate and regular rhythm.     Pulses: Normal pulses.  Pulmonary:     Effort: Pulmonary effort is normal. No respiratory distress.     Breath sounds: Normal breath sounds.  Abdominal:     Abdomen is soft. There is no abdominal tenderness. No rebound or guarding. No distention. Musculoskeletal:        General: No swelling. Normal range of motion.     Cervical back: Normal range of motion and neck supple.  Skin:    General: Skin is warm  and dry.     Capillary Refill: Capillary refill takes less than 2 seconds.     Findings: No rash.  Neurological:     Mental Status: The patient is awake and alert.      ED Results / Procedures / Treatments   Labs (all labs ordered are listed, but only abnormal results are displayed) Labs Reviewed  CBC WITH DIFFERENTIAL/PLATELET - Abnormal; Notable for the following components:      Result Value   WBC 11.8 (*)    Neutro Abs 7.8 (*)    Eosinophils Absolute 0.8 (*)    All other components within normal limits  BASIC METABOLIC PANEL - Abnormal; Notable for the following components:   Glucose, Bld 102 (*)    Calcium 8.6 (*)    All other components within normal limits  RESP PANEL BY RT-PCR (RSV, FLU A&B, COVID)  RVPGX2  GROUP A STREP BY PCR     EKG     RADIOLOGY I independently reviewed and interpreted imaging and agree with radiologists findings.     PROCEDURES:  Critical Care performed:   Procedures   MEDICATIONS ORDERED IN ED: Medications  acetaminophen (TYLENOL) tablet 650 mg (650 mg Oral Given 04/06/23 1023)     IMPRESSION / MDM / ASSESSMENT AND PLAN  / ED COURSE  I reviewed the triage vital signs and the nursing notes.   Differential diagnosis includes, but is not limited to, COVID, influenza, pneumonia, bronchitis.  Patient is awake and alert, tachycardic on arrival to 114, though normotensive and afebrile.  He has normal oxygen saturation on room air and demonstrates no increased work of breathing.  His HR was in the 90s during my evaluation.  Labs obtained are overall reassuring with the exception of a mild leukocytosis to 11.  Chest x-ray reveals a left lower lobe pneumonia.  Patient was treated with antibiotics for community-acquired pneumonia.  He was given a work note per her request.  Given that he is afebrile, able to eat and drink, has normal oxygen saturation on room air, I do not feel that he requires hospitalization at this time.  He is not immunocompromise.  We did discuss strict return precautions and the importance of close outpatient follow-up.  Patient understands and agrees with plan.  He was discharged in stable condition.   Patient's presentation is most consistent with acute presentation with potential threat to life or bodily function.    FINAL CLINICAL IMPRESSION(S) / ED DIAGNOSES   Final diagnoses:  Pneumonia of left lower lobe due to infectious organism     Rx / DC Orders   ED Discharge Orders          Ordered    cefdinir (OMNICEF) 300 MG capsule  2 times daily        04/06/23 1104    azithromycin (ZITHROMAX Z-PAK) 250 MG tablet        04/06/23 1104             Note:  This document was prepared using Dragon voice recognition software and may include unintentional dictation errors.   Jackelyn Hoehn, PA-C 04/06/23 1401    Janith Lima, MD 04/06/23 [phone]

## 2023-04-06 NOTE — Discharge Instructions (Signed)
Your x-ray shows a left lower lobe pneumonia.  Please take the antibiotics as prescribed.  Please return for any new, worsening, or change in symptoms or other concerns.  It was a pleasure caring for you today.

## 2024-01-06 ENCOUNTER — Emergency Department
Admission: EM | Admit: 2024-01-06 | Discharge: 2024-01-06 | Disposition: A | Discharge status: Home / Self Care | Payer: Self-pay | Attending: Emergency Medicine | Admitting: Emergency Medicine

## 2024-01-06 ENCOUNTER — Encounter: Payer: Self-pay | Admitting: Emergency Medicine

## 2024-01-06 ENCOUNTER — Other Ambulatory Visit: Payer: Self-pay

## 2024-01-06 ENCOUNTER — Emergency Department: Payer: Self-pay

## 2024-01-06 DIAGNOSIS — R079 Chest pain, unspecified: Secondary | ICD-10-CM

## 2024-01-06 DIAGNOSIS — R0789 Other chest pain: Secondary | ICD-10-CM | POA: Insufficient documentation

## 2024-01-06 LAB — TROPONIN I (HIGH SENSITIVITY)
Troponin I (High Sensitivity): 9 ng/L (ref <18)
Troponin I (High Sensitivity): 22 ng/L — ABNORMAL HIGH (ref <18)
Troponin I (High Sensitivity): 19 ng/L — ABNORMAL HIGH (ref <18)

## 2024-01-06 LAB — CBC
HCT: 46.4 % (ref 39.0–52.0)
Hemoglobin: 15.2 g/dL (ref 13.0–17.0)
MCH: 27.2 pg (ref 26.0–34.0)
MCHC: 32.8 g/dL (ref 30.0–36.0)
MCV: 83 fL (ref 80.0–100.0)
Platelets: 386 K/uL (ref 150–400)
RBC: 5.59 MIL/uL (ref 4.22–5.81)
RDW: 13 % (ref 11.5–15.5)
WBC: 7.6 K/uL (ref 4.0–10.5)
nRBC: 0 % (ref 0.0–0.2)

## 2024-01-06 LAB — BASIC METABOLIC PANEL WITH GFR
Anion gap: 11 (ref 5–15)
BUN: 15 mg/dL (ref 6–20)
CO2: 26 mmol/L (ref 22–32)
Calcium: 9.4 mg/dL (ref 8.9–10.3)
Chloride: 103 mmol/L (ref 98–111)
Creatinine, Ser: 1.07 mg/dL (ref 0.61–1.24)
GFR, Estimated: >60 mL/min (ref >60)
Glucose, Bld: 88 mg/dL (ref 70–99)
Potassium: 4.4 mmol/L (ref 3.5–5.1)
Sodium: 140 mmol/L (ref 135–145)

## 2024-01-06 NOTE — Discharge Instructions (Addendum)
 Your lab work and EKG were reassuring. You chest xray showed 2 cm indistinct density at the left lung apex. Please get a follow-up chest x-ray in 4 to 6 weeks to ensure resolution. This can be ordered by your primary care provider or cardiology.  Please schedule a follow up appointment with your cardiologist, their information is attached.   Return to the emergency department with any worsening symptoms.

## 2024-01-06 NOTE — ED Provider Notes (Signed)
 [facility] Provider Note    Event Date/Time   First MD Initiated Contact with Patient 01/06/24 1456     (approximate)   History   Chest Pain   HPI  Alex Hudson. is a 23 y.o. male with PMH of ventricular arrhythmia who presents for evaluation of centralized chest pain that began today while having sex.  Patient states that over the past couple days he has had some chest tightness.  He says that after the pain be began he passed out.  Patient denies chest pain at this time.  Denies shortness of breath, nausea, diaphoresis.   Patient has been seen by cardiology last year.  Was supposed to have a stress test done but never followed up.     Physical Exam   Triage Vital Signs: ED Triage Vitals  Encounter Vitals Group     BP 01/06/24 1305 (!) 131/98     Girls Systolic BP Percentile --      Girls Diastolic BP Percentile --      Boys Systolic BP Percentile --      Boys Diastolic BP Percentile --      Pulse Rate 01/06/24 1305 92     Resp 01/06/24 1305 17     Temp 01/06/24 1305 97.9 F (36.6 C)     Temp Source 01/06/24 1305 Oral     SpO2 01/06/24 1305 100 %     Weight 01/06/24 1306 165 lb (74.8 kg)     Height 01/06/24 1306 5' 8 (1.727 m)     Head Circumference --      Peak Flow --      Pain Score 01/06/24 1305 0     Pain Loc --      Pain Education --      Exclude from Growth Chart --     Most recent vital signs: Vitals:   01/06/24 1305 01/06/24 1716  BP: (!) 131/98 (!) 126/90  Pulse: 92 89  Resp: 17 18  Temp: 97.9 F (36.6 C) 97.9 F (36.6 C)  SpO2: 100% 98%   General: Awake, no distress.  CV:  Good peripheral perfusion.  RRR. Resp:  Normal effort.  CTAB. Abd:  No distention.  Other:  Radial pulses are 2+ and regular, posterior tibialis pulse 2+ and regular.   ED Results / Procedures / Treatments   Labs (all labs ordered are listed, but only abnormal results are displayed) Labs Reviewed  TROPONIN I (HIGH SENSITIVITY) -  Abnormal; Notable for the following components:      Result Value   Troponin I (High Sensitivity) 22 (*)    All other components within normal limits  TROPONIN I (HIGH SENSITIVITY) - Abnormal; Notable for the following components:   Troponin I (High Sensitivity) 19 (*)    All other components within normal limits  BASIC METABOLIC PANEL WITH GFR  CBC  TROPONIN I (HIGH SENSITIVITY)     EKG  ED provider interpretation: Sinus rhythm with sinus arrhythmia.  Vent. rate 81 BPM PR interval 142 ms QRS duration 98 ms QT/QTcB 346/401 ms P-R-T axes 65 81 68  RADIOLOGY  Chest x-ray obtained, I interpreted the images as well as reviewed the radiologist report which shows a 2 cm indistinct density at the left lung apex.  Radiologist reports this may be due to atelectasis or small focus of inflammation.  Recommended follow-up chest x-ray in 4 to 6 weeks to ensure resolution.  PROCEDURES:  Critical Care performed: No  Procedures   MEDICATIONS ORDERED IN ED: Medications - No data to display   IMPRESSION / MDM / ASSESSMENT AND PLAN / ED COURSE  I reviewed the triage vital signs and the nursing notes.                             23 year old male presents for evaluation of chest pain.  Blood pressure was little bit elevated upon presentation otherwise vital signs are stable.  Patient NAD on exam.  Differential diagnosis includes, but is not limited to, ACS, cardiac arrhythmia, anxiety, HOCM, pneumonia, pleurisy, pericarditis. Pulmonary embolism.  Patient's presentation is most consistent with acute complicated illness / injury requiring diagnostic workup.  CBC and BMP are unremarkable.  First troponin is not elevated.  Chest x-ray does not show any acute abnormalities but radiologist does note a 2 cm indistinct density at the left lung apex.  Patient was advised he should follow-up with his PCP for repeat chest x-ray in 4 to 6 weeks to ensure resolution.  EKG shows sinus rhythm with  sinus arrhythmia.  Do not see findings consistent with HCOM.  Based on [facility] rule can rule out pulmonary embolism.  Do not feel that clinical picture fits with PE either.  Would not expect patient to have rapid resolution of symptoms if he had a PE.  Patient had equal pulses in bilateral upper and lower extremities and did not report tearing pain to his back.  Very low suspicion for dissection as a cause.  Given that his pain was within 3 hours of the initial troponin will obtain a second 1 to ensure it is not going up.  If troponin is not increasing feel patient will be stable for outpatient management.  Will recommend that he schedule follow-up appointment with his cardiologist.   Clinical Course as of 01/06/24 1803  Tue Jan 06, 2024  1725 Troponin I (High Sensitivity)(!) 2nd troponin is elevated at 22. Patient remains asymptomatic. Will obtain a 3rd troponin. If it decreases or stabilizes, feel patient will be ok for outpatient follow up, if trending upward will recommend admission. [LD]  1749 Troponin I (High Sensitivity)(!) Troponin has stabilized, patient stable for outpatient management. Will advise him to follow up with cardiology. [LD]    Clinical Course User Index [LD] Cleaster Tinnie LABOR, PA-C     FINAL CLINICAL IMPRESSION(S) / ED DIAGNOSES   Final diagnoses:  Chest pain, unspecified type     Rx / DC Orders   ED Discharge Orders          Ordered    Ambulatory Referral to Primary Care (Establish Care)        01/06/24 1803             Note:  This document was prepared using Dragon voice recognition software and may include unintentional dictation errors.   Cleaster Tinnie LABOR, PA-C 01/06/24 1804    Waymond Lorelle Cummins, MD 01/06/24 [phone]

## 2024-01-06 NOTE — ED Triage Notes (Signed)
 Patient to ED via POV for centralized CP. Ongoing for a few days. States he was having sex today when he started having the CP and had a syncopal episode. Currently does not have any CP. Does have a cardiologist but states he is unsure if ever officially dx with anything.

## 2024-02-17 ENCOUNTER — Emergency Department
Admission: EM | Admit: 2024-02-17 | Discharge: 2024-02-17 | Disposition: A | Discharge status: Home / Self Care | Payer: Self-pay | Attending: Emergency Medicine | Admitting: Emergency Medicine

## 2024-02-17 ENCOUNTER — Emergency Department: Payer: Self-pay

## 2024-02-17 ENCOUNTER — Other Ambulatory Visit: Payer: Self-pay

## 2024-02-17 DIAGNOSIS — R55 Syncope and collapse: Secondary | ICD-10-CM | POA: Insufficient documentation

## 2024-02-17 DIAGNOSIS — R079 Chest pain, unspecified: Secondary | ICD-10-CM | POA: Insufficient documentation

## 2024-02-17 DIAGNOSIS — R519 Headache, unspecified: Secondary | ICD-10-CM | POA: Insufficient documentation

## 2024-02-17 DIAGNOSIS — G43809 Other migraine, not intractable, without status migrainosus: Secondary | ICD-10-CM

## 2024-02-17 DIAGNOSIS — R112 Nausea with vomiting, unspecified: Secondary | ICD-10-CM | POA: Insufficient documentation

## 2024-02-17 LAB — COMPREHENSIVE METABOLIC PANEL WITH GFR
ALT: 14 U/L (ref 0–44)
AST: 26 U/L (ref 15–41)
Albumin: 4.4 g/dL (ref 3.5–5.0)
Alkaline Phosphatase: 47 U/L (ref 38–126)
Anion gap: 10 (ref 5–15)
BUN: 17 mg/dL (ref 6–20)
CO2: 24 mmol/L (ref 22–32)
Calcium: 9.6 mg/dL (ref 8.9–10.3)
Chloride: 105 mmol/L (ref 98–111)
Creatinine, Ser: 1.08 mg/dL (ref 0.61–1.24)
GFR, Estimated: >60 mL/min (ref >60)
Glucose, Bld: 102 mg/dL — ABNORMAL HIGH (ref 70–99)
Potassium: 3.8 mmol/L (ref 3.5–5.1)
Sodium: 139 mmol/L (ref 135–145)
Total Bilirubin: 0.7 mg/dL (ref 0.0–1.2)
Total Protein: 7.9 g/dL (ref 6.5–8.1)

## 2024-02-17 LAB — CBC
HCT: 46.4 % (ref 39.0–52.0)
Hemoglobin: 15.9 g/dL (ref 13.0–17.0)
MCH: 27.3 pg (ref 26.0–34.0)
MCHC: 34.3 g/dL (ref 30.0–36.0)
MCV: 79.7 fL — ABNORMAL LOW (ref 80.0–100.0)
Platelets: 381 K/uL (ref 150–400)
RBC: 5.82 MIL/uL — ABNORMAL HIGH (ref 4.22–5.81)
RDW: 12.9 % (ref 11.5–15.5)
WBC: 7.2 K/uL (ref 4.0–10.5)
nRBC: 0 % (ref 0.0–0.2)

## 2024-02-17 LAB — TROPONIN I (HIGH SENSITIVITY): Troponin I (High Sensitivity): <2 ng/L (ref <18)

## 2024-02-17 NOTE — ED Provider Notes (Signed)
 [facility] Provider Note    Event Date/Time   First MD Initiated Contact with Patient 02/17/24 1555     (approximate)  History   Chief Complaint: Chest Pain, Migraine (/), and Near Syncope  HPI  Alex Hudson. is a 23 y.o. male with a past medical history of syncopal episodes, migraines, presents to the emergency department for evaluation.  According to the patient over the last 2 months he has been having near syncopal episodes, he is following up with his cardiologist Dr. Florencio, actually had a Holter monitor removed recently.  Patient states on Friday while he still had the Holter monitor on he also had an episode of chest pain but has not had any more since.  Patient states that last night he developed more of a migraine type headache but was worse than his typical migraine.  Patient states he had nausea and vomiting as well.  However he states his headache is largely resolved now and is only mild.  Patient denies any abdominal pain.  No fever.  Physical Exam   Triage Vital Signs: ED Triage Vitals [02/17/24 1323]  Encounter Vitals Group     BP 122/86     Girls Systolic BP Percentile      Girls Diastolic BP Percentile      Boys Systolic BP Percentile      Boys Diastolic BP Percentile      Pulse Rate 96     Resp 18     Temp 98.4 F (36.9 C)     Temp Source Oral     SpO2 98 %     Weight      Height      Head Circumference      Peak Flow      Pain Score 7     Pain Loc      Pain Education      Exclude from Growth Chart     Most recent vital signs: Vitals:   02/17/24 1323  BP: 122/86  Pulse: 96  Resp: 18  Temp: 98.4 F (36.9 C)  SpO2: 98%    General: Awake, no distress.  CV:  Good peripheral perfusion.  Regular rate and rhythm  Resp:  Normal effort.  Equal breath sounds bilaterally.  Abd:  No distention.    ED Results / Procedures / Treatments   EKG  EKG viewed and interpreted by myself shows a normal sinus rhythm at 90 bpm  with a narrow QRS, normal axis, normal intervals, nonspecific ST changes  RADIOLOGY  I have reviewed and interpreted the chest x-ray images.  No consolidation seen on my evaluation. Radiology has read the x-ray as negative. CT head negative.   MEDICATIONS ORDERED IN ED: Medications - No data to display   IMPRESSION / MDM / ASSESSMENT AND PLAN / ED COURSE  I reviewed the triage vital signs and the nursing notes.  Patient's presentation is most consistent with acute presentation with potential threat to life or bodily function.  Patient presents to the emergency department for headache nausea and vomiting also an episode of near syncope and chest pain on Friday.  Patient is following up with cardiology regarding the syncopal/near syncopal episodes.  Patient's workup today shows a reassuring CBC, reassuring chemistry and reassuringly negative troponin.  CT scan of the head is negative and chest x-ray is clear.  Vital signs are reassuring.  Given the patient's symptoms I offered to place an IV to IV hydrate and to  treat his headache with medications as well as nausea medication.  Patient states he is actually feeling much better from the headache and nausea perspective does not like taking medications and would prefer to be discharged home if his workup did not show any significant finding.  Given the patient's reassuring physical exam reassuring workup and imaging I believe the patient safe for discharge home with outpatient follow-up.  Patient agreeable to plan of care.  FINAL CLINICAL IMPRESSION(S) / ED DIAGNOSES   Near syncope Chest pain Nausea vomiting Headache   Note:  This document was prepared using Dragon voice recognition software and may include unintentional dictation errors.   Dorothyann Drivers, MD 02/17/24 [phone]

## 2024-02-17 NOTE — ED Triage Notes (Signed)
 Pt to ED via POV from home. Fiance with pt reports pt possibly passed out in car on way to hospital. Pt alert to RN arrival to car and assisted out by this RN and family member. Pt reports woke up this am with HA, N/V and CP w/ emesis. Pt was seen at Dr. Irine office today to have halter monitor removed. Pt reports halter monitor in place due to multiple syncopal episodes and CP. Pt reports hx of vertigo and viral meningitis. No blood thinner.

## 2024-02-17 NOTE — Discharge Instructions (Signed)
 Please drink plenty fluids over the next several days.  Please take Tylenol  or ibuprofen  as needed for headache as written on the box.  Return to the emergency department for any further chest pain, trouble breathing, any significant headache or any other symptom personally concerning to yourself.  Otherwise please follow-up with your primary care doctor.
